# Patient Record
Sex: Male | Born: 1955 | Race: White | Hispanic: No | Smoking: Never smoker
Health system: Southern US, Community
[De-identification: ages and names within clinical notes are randomized; demographics above are authoritative.]

## PROBLEM LIST (undated history)

## (undated) DIAGNOSIS — K219 Gastro-esophageal reflux disease without esophagitis: Secondary | ICD-10-CM

## (undated) DIAGNOSIS — M1990 Unspecified osteoarthritis, unspecified site: Secondary | ICD-10-CM

## (undated) DIAGNOSIS — R269 Unspecified abnormalities of gait and mobility: Secondary | ICD-10-CM

## (undated) DIAGNOSIS — F329 Major depressive disorder, single episode, unspecified: Secondary | ICD-10-CM

## (undated) DIAGNOSIS — M1711 Unilateral primary osteoarthritis, right knee: Secondary | ICD-10-CM

## (undated) DIAGNOSIS — F319 Bipolar disorder, unspecified: Secondary | ICD-10-CM

## (undated) DIAGNOSIS — F419 Anxiety disorder, unspecified: Secondary | ICD-10-CM

## (undated) DIAGNOSIS — F4310 Post-traumatic stress disorder, unspecified: Secondary | ICD-10-CM

## (undated) DIAGNOSIS — F32A Depression, unspecified: Secondary | ICD-10-CM

## (undated) HISTORY — DX: Unspecified abnormalities of gait and mobility: R26.9

## (undated) HISTORY — PX: KNEE SURGERY: SHX244

## (undated) HISTORY — PX: ORIF FINGER / THUMB FRACTURE: SUR932

## (undated) HISTORY — DX: Post-traumatic stress disorder, unspecified: F43.10

## (undated) HISTORY — DX: Depression, unspecified: F32.A

## (undated) HISTORY — DX: Anxiety disorder, unspecified: F41.9

## (undated) HISTORY — DX: Major depressive disorder, single episode, unspecified: F32.9

---
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
## 1989-04-27 HISTORY — PX: HERNIA REPAIR: SHX51

## 1989-04-27 HISTORY — PX: WRIST SURGERY: SHX841

## 2004-06-28 ENCOUNTER — Ambulatory Visit (HOSPITAL_COMMUNITY): Admission: RE | Admit: 2004-06-28 | Discharge: 2004-06-28 | Payer: Self-pay | Admitting: Orthopedic Surgery

## 2004-07-22 ENCOUNTER — Ambulatory Visit: Payer: Self-pay | Admitting: Pulmonary Disease

## 2004-07-22 ENCOUNTER — Ambulatory Visit: Admission: RE | Admit: 2004-07-22 | Discharge: 2004-07-22 | Payer: Self-pay | Admitting: Otolaryngology

## 2007-04-15 ENCOUNTER — Ambulatory Visit (HOSPITAL_COMMUNITY): Admission: RE | Admit: 2007-04-15 | Discharge: 2007-04-15 | Payer: Self-pay | Admitting: Family Medicine

## 2007-06-07 ENCOUNTER — Emergency Department (HOSPITAL_COMMUNITY): Admission: EM | Admit: 2007-06-07 | Discharge: 2007-06-08 | Payer: Self-pay | Admitting: Emergency Medicine

## 2011-01-12 NOTE — Procedures (Signed)
NAMERYDER, MAN                 ACCOUNT NO.:  [id number]   MEDICAL RECORD NO.:  [id number]          PATIENT TYPE:  OUT   LOCATION:  SLEEP LAB                     FACILITY:  [facility]   PHYSICIAN:  Marcelyn Bruins, M.D. [facility] DATE OF BIRTH:  1956-07-22   DATE OF STUDY:  07/22/2004                              NOCTURNAL POLYSOMNOGRAM   REFERRING PHYSICIAN:  Dillard Cannon, MD   INDICATIONS FOR STUDY:  Hypersomnia with sleep apnea. Epworth sleepiness  score is 11.   SLEEP ARCHITECTURE:  The patient had a total sleep time of 352 minutes with  sleep efficiency of 89%. There was decreased slow wave sleep in REM. Sleep  onset latency was slightly prolonged as was REM latency.   IMPRESSION/RECOMMENDATIONS:  1.  Small numbers of obstructive events which do not meet the RDI's criteria      of the obstructive sleep apnea syndrome. The patient, however, did have      very loud snoring and large numbers of nonspecific arousals that are      somewhat concerning for the upper airway resistant syndrome. It should      also be noted that the patient had no sleep in the supine position which      may underestimate his degree of sleep apnea.  2.  No clinically significant cardiac arrhythmias.  3.  Large numbers of leg jerks with significant sleep disruption. Clinical      correlation is suggested.      KC/MEDQ  D:  08/08/2004 15:46:28  T:  08/08/2004 21:54:54  Job:  [id number]

## 2011-06-07 LAB — URINALYSIS, ROUTINE W REFLEX MICROSCOPIC
Bilirubin Urine: NEGATIVE
Glucose, UA: NEGATIVE
Hgb urine dipstick: NEGATIVE
Ketones, ur: NEGATIVE
Nitrite: NEGATIVE
Protein, ur: NEGATIVE
Specific Gravity, Urine: 1.015
Urobilinogen, UA: 0.2
pH: 6.5

## 2012-08-27 HISTORY — PX: SHOULDER SURGERY: SHX246

## 2012-12-08 ENCOUNTER — Ambulatory Visit: Payer: Self-pay | Admitting: Neurology

## 2013-12-17 ENCOUNTER — Encounter: Payer: Self-pay | Admitting: Neurology

## 2013-12-17 ENCOUNTER — Ambulatory Visit (INDEPENDENT_AMBULATORY_CARE_PROVIDER_SITE_OTHER): Payer: Medicare Other | Admitting: Neurology

## 2013-12-17 ENCOUNTER — Encounter (INDEPENDENT_AMBULATORY_CARE_PROVIDER_SITE_OTHER): Payer: Self-pay

## 2013-12-17 VITALS — BP 131/92 | HR 75 | Ht 69.0 in | Wt 234.0 lb

## 2013-12-17 DIAGNOSIS — G219 Secondary parkinsonism, unspecified: Secondary | ICD-10-CM

## 2013-12-17 DIAGNOSIS — R0989 Other specified symptoms and signs involving the circulatory and respiratory systems: Secondary | ICD-10-CM

## 2013-12-17 DIAGNOSIS — E669 Obesity, unspecified: Secondary | ICD-10-CM

## 2013-12-17 DIAGNOSIS — R0683 Snoring: Secondary | ICD-10-CM

## 2013-12-17 DIAGNOSIS — R0609 Other forms of dyspnea: Secondary | ICD-10-CM

## 2013-12-17 DIAGNOSIS — G2119 Other drug induced secondary parkinsonism: Secondary | ICD-10-CM

## 2013-12-17 DIAGNOSIS — R269 Unspecified abnormalities of gait and mobility: Secondary | ICD-10-CM

## 2013-12-17 DIAGNOSIS — W19XXXA Unspecified fall, initial encounter: Secondary | ICD-10-CM

## 2013-12-17 HISTORY — DX: Unspecified abnormalities of gait and mobility: R26.9

## 2013-12-17 NOTE — Patient Instructions (Addendum)
1. Please use a cane for safety, until advised otherwise by Physical therapy.  2. Talk to Dr. Merleen MillinerWinfield about getting a referral to Physical therapy locally.  3. I think your gait and balance disorder is likely from medication effect, due to being on multiple neuro/psychotropic medications.  4. Please discuss with Dr. 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 5. I think I can see you back as needed.  6. I think your brain MRI findings are not concerning.  7. Talk to Dr. Merleen MillinerWinfield about getting a sleep study done.

## 2013-12-17 NOTE — Progress Notes (Signed)
Subjective:    Patient ID: Mark HancockDanny R Hull is a 58 y.o. male.  HPI    Mark FoleySaima Malee Grays, MD, PhD [facility] [address], [address] [address] [place], [place] [address]   Dear Dr. Merleen Hull,  I saw your patient, Mark SouthwardDanny Lolli, upon your kind request in my neurologic clinic today for initial consultation of his gait disorder and recurrent falls. The patient is unaccompanied today. As you know, Mark Hull is a 58 year old right-handed gentleman with an underlying medical history of arthritis, s/p knee surgeries x 3 on the R, s/p L shoulder surgery earlier this year, BPAD, PTSD (followed by Dr. Tomasa Hull), and obesity, status post knee surgery on 09/18/2012, who reports problems with his balance for the past 6-9 months, slowly progressive. He denies vertigo, no lightheadedness, no syncopal spell or pre-syncope, no SOB and overall he feels it was gradual in onset. He [facility] on 11/27/13: minimal non-specific WM changes. He is, of note on multiple psychotropic medications, namely Prozac 80 mg q AM, Lamictal 300 mg daily, gabapentin 800 mg 3 times a day, Percocet twice a day as needed, Ambien, Seroquel 300 mg daily and clonazepam 1 mg twice daily. On a separate note, he endorses snoring and his wife has wondered whether he has pauses in his breathing while asleep. He has never had a sleep study.  I saw him one time before on 10/08/2012 for a different problem, at which time he presented with abnormal sensations and dysesthesias in his lower extremities. At that time, his exam is nonfocal and I felt he may have a form of restless leg syndrome. Those symptoms resolved on their own.  He had been seen in our office several years ago for numbness in his thighs and paresthesias. 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   He is retired/disabled and lives at home with his wife.  He has an [employer] degree and had 1 child that is diceased.  The pt denies the use of tobacco, quit drinking alcohol 30 years ago, quit using recreational drugs 40 years ago and drinks large amounts of caffeinated tea. There is Fhx of cancer, heart disease and DM.   His Past Medical History Is Significant For: Past Medical History  Diagnosis Date  . Depression   . Anxiety   . PTSD (post-traumatic stress disorder)     His Past Surgical History Is Significant For: Past Surgical History  Procedure Laterality Date  . Tonsillectomy  1970's  . Knee surgery Right 1970's, 90's, 2011  . Orif finger / thumb fracture    . Wrist surgery  1990's  . Hernia repair  1990's  . Shoulder surgery  2014    His Family History Is Significant For: Family History  Problem Relation Age of Onset  . Cancer Sister   . Heart failure Father   . Cancer Father   . COPD Mother     His Social History Is Significant For: History   Social History  . Marital Status: Married    Spouse Name: N/A    Number of Children: N/A  . Years of Education: N/A   Social History Main Topics  . Smoking status: Never Smoker   . Smokeless tobacco: None  . Alcohol Use: No  . Drug Use: No  . Sexual Activity: None   Other Topics Concern  . None   Social History Narrative  . None    His Allergies Are:  Allergies  Allergen Reactions  . Wellbutrin [Bupropion] Photosensitivity and Anxiety  :   His Current Medications Are:  Outpatient Encounter Prescriptions as of 12/17/2013  Medication Sig  . atorvastatin (LIPITOR) 10 MG tablet Take 1 tablet by mouth daily.  . clonazePAM (KLONOPIN) 1 MG tablet Take 2 tablets by mouth daily.  . finasteride (PROSCAR) 5 MG tablet Take 1 tablet by mouth daily.  Marland Kitchen FLUoxetine (PROZAC) 40 MG capsule Take 2 capsules by mouth daily.  Marland Kitchen gabapentin (NEURONTIN) 800 MG tablet Take 3 tablets by mouth daily.  Marland Kitchen lamoTRIgine (LAMICTAL) 200 MG tablet 1 tablet.  Marland Kitchen omeprazole (PRILOSEC) 20 MG capsule Take 1 capsule by mouth daily.  Marland Kitchen oxyCODONE-acetaminophen  (PERCOCET/ROXICET) 5-325 MG per tablet Take 1 tablet by mouth 2 (two) times daily as needed.  . tamsulosin (FLOMAX) 0.4 MG CAPS capsule   . testosterone (ANDROGEL) 50 MG/5GM (1%) GEL   . VIAGRA 100 MG tablet Take 1 tablet by mouth as needed.  . zolpidem (AMBIEN) 10 MG tablet Take 1 tablet by mouth at bedtime.  Marland Kitchen QUEtiapine (SEROQUEL) 300 MG tablet   :   Review of Systems:  Out of a complete 14 point review of systems, all are reviewed and negative with the exception of these symptoms as listed below:   Review of Systems  Constitutional: Positive for activity change (disinterest) and fatigue.  HENT: Positive for trouble swallowing.   Eyes: Negative.   Respiratory: Positive for apnea (snoring).   Cardiovascular: Negative.   Gastrointestinal: Negative.   Endocrine: Negative.   Genitourinary: Positive for difficulty urinating.       Impotence  Musculoskeletal: Positive for arthralgias and joint swelling.  Skin: Negative.   Allergic/Immunologic: Negative.   Neurological: Positive for tremors and headaches.  Hematological: Negative.   Psychiatric/Behavioral: Positive for dysphoric mood, decreased concentration and agitation. The patient is nervous/anxious.        Bi-Polar disorder, PTSD    Objective:  Neurologic Exam  Physical Exam Physical Examination:   Filed Vitals:   12/17/13 1321  BP: 131/92  Pulse: 75         Physical Examination:   Filed Vitals:   12/17/13 1321  BP: 131/92  Pulse: 75   General Examination: The patient is a very pleasant 58 y.o. male in no acute distress.  HEENT: Normocephalic, atraumatic, pupils are equal, round and reactive to light and accommodation. Funduscopic exam is normal with sharp disc margins noted. Extraocular tracking shows mild saccadic breakdown without nystagmus noted. There is no limitation to his gaze. There is mild decrease in eye blink rate. Hearing is intact. Tympanic membranes are clear bilaterally. Face is symmetric with  mild facial masking and normal facial sensation. There is no lip, neck or jaw tremor. Neck is mildly rigid with intact passive ROM. There are no carotid bruits on auscultation. Oropharynx exam reveals mild mouth dryness. Mild airway crowding is noted. Mallampati is class III. Tongue protrudes centrally and palate elevates symmetrically. Neck size is 18.5 inches. There is no drooling.   Chest: is clear to auscultation without wheezing, rhonchi or crackles noted.  Heart: sounds are regular and normal without murmurs, rubs or gallops noted.   Abdomen: is soft, non-tender and non-distended with normal bowel sounds appreciated on auscultation.  Extremities: There is no pitting edema in the distal lower extremities bilaterally. Pedal pulses are intact. There are no varicose veins.  Skin: is warm and dry with no trophic changes noted. Age-related changes are noted on the skin.   Musculoskeletal: exam reveals  no obvious joint deformities, tenderness, joint swelling or erythema.  Neurologically:  Mental status: The patient is awake and alert, paying good  attention. He is able to completely provide the history. He is oriented to: person, place, time/date, situation, day of week, month of year and year. His memory, attention, language and knowledge are intact. There is no aphasia, agnosia, apraxia or anomia. There is a mild degree of bradyphrenia. Speech is mildly hypophonic with no dysarthria noted. Mood is congruent and affect is blunted.  Cranial nerves are as described above under HEENT exam. In addition, shoulder shrug is normal with equal shoulder height noted.  Motor exam: Normal bulk, and strength for age is noted. There are no dyskinesias noted. Tone is not overtly rigid with absence of cogwheeling in the upper extremities. There is overall mild bradykinesia. There is no drift or rebound.  There is no tremor. Romberg is negative.  Reflexes are 1+ in the upper extremities and 2+ in the lower  extremities. Toes are downgoing bilaterally.  Fine motor skills exam: Finger taps are mildly impaired on the right and mildly impaired on the left. Hand movements are mildly impaired on the right and mildly impaired on the left. RAP (rapid alternating patting) is mildly impaired on the right and mildly impaired on the left. Foot taps are mildly impaired on the right and mildly impaired on the left. Foot agility (in the form of heel stomping) is mildly impaired on the right and mildly impaired on the left.    Cerebellar testing shows no dysmetria or intention tremor on finger to nose testing. Heel to shin is unremarkable bilaterally. There is no truncal or gait ataxia.   Sensory exam is intact to light touch, pinprick, vibration, temperature sense and proprioception in the upper and lower extremities.   Gait, station and balance: He stands up from the seated position with mild difficulty and does not need to push up with His hands. He needs no assistance. No veering to one side is noted. He is not noted to lean to the side. Posture is mildly stooped, advanced for age. Stance is narrow-based. He walks with decrease in stride length and pace and decreased arm swing b/l and has a tendency to look down at his feet while walking. He turns in 3 steps. Tandem walk is rather difficult for him and balance is mildly impaired. He has intact toe stands but has difficulty with heel stance.     Assessment and Plan:   In summary, Mark [facility] is a very pleasant 57 y.o.-year old male with an underlying medical history of arthritis, s/p knee surgeries x 3 on the R, s/p L shoulder surgery earlier this year, BPAD, PTSD (followed by Dr. Tomasa Rand), and obesity, status post knee surgery on 09/18/2012, who reports problems with his balance for the past 6-9 months, slowly progressive. His history and physical examination are consistent with a gait disorder and mild parkinsonism. His balance is mildly off. He is advised that he  most likely has problems with his gait and balance due to medication side effects. He is on multiple psychotropic medications and my worry is that he is now experiencing side effects from being on these medicines. 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 He is advised to start using a cane for safety. Furthermore, he is advised to  talk with you about a referral to physical therapy for gait and balance training and he should be able to do this locally. He is also endorsing snoring and some concern for apneas in his sleep. He would benefit from a sleep study which he can pursue locally as well and he is reminded to bring this up with you. If needed we would be happy to do this here but that would be a rather long drive for him. I explained to him that even though he has been on these medications for quite some time, with aging and with time the body can start getting more sensitive to some of the side effects. I have encouraged him to talk to his psychiatrist about potentially lowering some of his medications. He is agreeable to this. From my end of things there is really not a whole lot I would be able to for. His MRI brain does not suggest any sinister disease and shows only mild white matter changes which are nonspecific. I reassured him in that regard. I would be happy to see him back on an as-needed basis. He was in agreement.   Thank you very much for allowing me to participate in the care of this nice patient. If I can be of any further assistance to you please do not hesitate to call me at [phone].  Sincerely,   Mark FoleySaima Aliannah Holstrom, MD, PhD

## 2014-11-10 ENCOUNTER — Other Ambulatory Visit: Payer: Self-pay | Admitting: Physical Medicine and Rehabilitation

## 2014-11-10 DIAGNOSIS — M25511 Pain in right shoulder: Secondary | ICD-10-CM

## 2014-11-22 ENCOUNTER — Ambulatory Visit
Admission: RE | Admit: 2014-11-22 | Discharge: 2014-11-22 | Disposition: A | Discharge status: Home / Self Care | Payer: Medicare Other | Source: Ambulatory Visit | Attending: Physical Medicine and Rehabilitation | Admitting: Physical Medicine and Rehabilitation

## 2014-11-22 DIAGNOSIS — M25511 Pain in right shoulder: Secondary | ICD-10-CM

## 2014-11-22 MED ORDER — IOHEXOL 180 MG/ML  SOLN
12.0000 mL | Freq: Once | INTRAMUSCULAR | Status: AC | PRN
  Administered 2014-11-22: 12 mL via INTRA_ARTICULAR

## 2016-10-19 ENCOUNTER — Other Ambulatory Visit: Payer: Self-pay | Admitting: Orthopedic Surgery

## 2016-10-25 ENCOUNTER — Encounter (HOSPITAL_COMMUNITY): Payer: Self-pay

## 2016-10-25 ENCOUNTER — Encounter (HOSPITAL_COMMUNITY)
Admission: RE | Admit: 2016-10-25 | Discharge: 2016-10-25 | Disposition: A | Discharge status: Home / Self Care | Payer: Medicare Other | Source: Ambulatory Visit | Attending: Orthopedic Surgery | Admitting: Orthopedic Surgery

## 2016-10-25 DIAGNOSIS — Z01812 Encounter for preprocedural laboratory examination: Secondary | ICD-10-CM | POA: Diagnosis not present

## 2016-10-25 DIAGNOSIS — M1711 Unilateral primary osteoarthritis, right knee: Secondary | ICD-10-CM | POA: Insufficient documentation

## 2016-10-25 HISTORY — DX: Gastro-esophageal reflux disease without esophagitis: K21.9

## 2016-10-25 HISTORY — DX: Unspecified osteoarthritis, unspecified site: M19.90

## 2016-10-25 HISTORY — DX: Bipolar disorder, unspecified: F31.9

## 2016-10-25 LAB — CBC
HEMATOCRIT: 42.8 % (ref 39.0–52.0)
HEMOGLOBIN: 14.8 g/dL (ref 13.0–17.0)
MCH: 31.3 pg (ref 26.0–34.0)
MCHC: 34.6 g/dL (ref 30.0–36.0)
MCV: 90.5 fL (ref 78.0–100.0)
Platelets: 248 10*3/uL (ref 150–400)
RBC: 4.73 MIL/uL (ref 4.22–5.81)
RDW: 12.9 % (ref 11.5–15.5)
WBC: 6.4 10*3/uL (ref 4.0–10.5)

## 2016-10-25 LAB — BASIC METABOLIC PANEL
Anion gap: 8 (ref 5–15)
BUN: 9 mg/dL (ref 6–20)
CHLORIDE: 104 mmol/L (ref 101–111)
CO2: 27 mmol/L (ref 22–32)
Calcium: 9.6 mg/dL (ref 8.9–10.3)
Creatinine, Ser: 0.96 mg/dL (ref 0.61–1.24)
GFR calc Af Amer: >60 mL/min (ref >60)
GFR calc non Af Amer: >60 mL/min (ref >60)
Glucose, Bld: 81 mg/dL (ref 65–99)
POTASSIUM: 4.3 mmol/L (ref 3.5–5.1)
Sodium: 139 mmol/L (ref 135–145)

## 2016-10-25 LAB — SURGICAL PCR SCREEN
MRSA, PCR: NEGATIVE
Staphylococcus aureus: POSITIVE — AB

## 2016-10-25 NOTE — Pre-Procedure Instructions (Signed)
[facility]  10/25/2016      [id number] [place] [place] - [address] [facility] [address] [place] [id number] Phone: [phone] Fax: [phone]  [phone] [place] - [address] [place] [id number] Phone: [phone] Fax: [phone]  [id number] [place] [place] - [address] [place] [id number] Phone: [phone] Fax: [phone]    Your procedure is scheduled on Tuesday March 13.  [facility] at [facility]:00 A.M.  Call this number if you have problems the morning of surgery:  [phone]   Remember:  Do not eat food or drink liquids after midnight.  Take these medicines the morning of surgery with A SIP OF WATER: clonazepam (klonopni), fluoxetine (prozac), gabapentin (neurontin), omeprazole (prilosec), oxycodone (percocet) if needed  7 days prior to surgery STOP taking any Aspirin, Aleve, Naproxen, Ibuprofen, Motrin, Advil, Goody's, BC's, all herbal medications, fish oil, and all vitamins    Do not wear jewelry, make-up or nail polish.  Do not wear lotions, powders, or perfumes, or deoderant.  Do not shave 48 hours prior to surgery.  Men may shave face and neck.  Do not bring valuables to the hospital.  [facility] is not responsible for any belongings or valuables.  Contacts, dentures or bridgework may not be worn into surgery.  Leave your suitcase in the car.  After surgery it may be brought to your room.  For patients admitted to the hospital, discharge time will be determined by your treatment team.  Patients discharged the day of surgery will not be allowed to drive home.   Special instructions:    [facility]- Preparing For Surgery  Before surgery, you can play an important role. Because skin is not sterile, your skin needs to be as free of germs as possible. You can reduce the number of germs on your skin by  washing with CHG (chlorahexidine gluconate) Soap before surgery.  CHG is an antiseptic cleaner which kills germs and bonds with the skin to continue killing germs even after washing.  Please do not use if you have an allergy to CHG or antibacterial soaps. If your skin becomes reddened/irritated stop using the CHG.  Do not shave (including legs and underarms) for at least 48 hours prior to first CHG shower. It is OK to shave your face.  Please follow these instructions carefully.   1. Shower the NIGHT BEFORE SURGERY and the MORNING OF SURGERY with CHG.   2. If you chose to wash your hair, wash your hair first as usual with your normal shampoo.  3. After you shampoo, rinse your hair and body thoroughly to remove the shampoo.  4. Use CHG as you would any other liquid soap. You can apply CHG directly to the skin and wash gently with a scrungie or a clean washcloth.   5. Apply the CHG Soap to your body ONLY FROM THE NECK DOWN.  Do not use on open wounds or open sores. Avoid contact with your eyes, ears, mouth and genitals (private parts). Wash genitals (private parts) with your normal soap.  6. Wash thoroughly, paying special attention to the area where your surgery will be performed.  7. Thoroughly rinse your body with warm water from the neck down.  8. DO NOT shower/wash with your normal soap after using and rinsing off the CHG Soap.  9. Pat yourself  dry with a CLEAN TOWEL.   10. Wear CLEAN PAJAMAS   11. Place CLEAN SHEETS on your bed the night of your first shower and DO NOT SLEEP WITH PETS.    Day of Surgery: Do not apply any deodorants/lotions. Please wear clean clothes to the hospital/surgery center.      Please read over the following fact sheets that you were given. Total Joint Packet and MRSA Information

## 2016-10-25 NOTE — Progress Notes (Signed)
PCP: Merleen MillinerWinfield Pt denies cardiologist but states he did have an EKG done by a heart doctor in [place] d/t numbness in his leg. Pt to find doctor's name when gets home and call with name. Will request EKG from doctor and if not received do EKG DOS.   Pt reports having heart cath and stress test >15 years ago that were normal d/t issues with chest pain at the time. Pt denies any symptoms since then.   No chest pain, SOB or signs of infection at PAT appointment.

## 2016-10-25 NOTE — Progress Notes (Signed)
PCR negative MRSA, positive MSSA. Message left for patient on voicemail with call back number if any questions. Prescription called to pharmacy.

## 2016-10-29 NOTE — Progress Notes (Signed)
Spoke with Melodie at Dr. Oneta RackKotlaba's office & requested EKG & last office visit note  [facility].  She reports the EKG is dated from Jan. 2018.

## 2016-11-05 MED ORDER — CEFAZOLIN SODIUM-DEXTROSE 2-4 GM/100ML-% IV SOLN
2.0000 g | INTRAVENOUS | Status: AC
  Administered 2016-11-06: 2 g via INTRAVENOUS
  Filled 2016-11-05: qty 100

## 2016-11-06 ENCOUNTER — Encounter (HOSPITAL_COMMUNITY): Payer: Self-pay | Admitting: *Deleted

## 2016-11-06 ENCOUNTER — Inpatient Hospital Stay (HOSPITAL_COMMUNITY): Payer: Medicare Other

## 2016-11-06 ENCOUNTER — Inpatient Hospital Stay (HOSPITAL_COMMUNITY)
Admission: RE | Admit: 2016-11-06 | Discharge: 2016-11-07 | DRG: 470 | Disposition: A | Discharge status: Home Health | Payer: Medicare Other | Source: Ambulatory Visit | Attending: Orthopedic Surgery | Admitting: Orthopedic Surgery

## 2016-11-06 ENCOUNTER — Encounter (HOSPITAL_COMMUNITY)
Admission: RE | Disposition: A | Discharge status: Home Health | Payer: Self-pay | Source: Ambulatory Visit | Attending: Orthopedic Surgery

## 2016-11-06 ENCOUNTER — Inpatient Hospital Stay (HOSPITAL_COMMUNITY): Payer: Medicare Other | Admitting: Anesthesiology

## 2016-11-06 DIAGNOSIS — Z79899 Other long term (current) drug therapy: Secondary | ICD-10-CM | POA: Diagnosis not present

## 2016-11-06 DIAGNOSIS — Z7982 Long term (current) use of aspirin: Secondary | ICD-10-CM

## 2016-11-06 DIAGNOSIS — K219 Gastro-esophageal reflux disease without esophagitis: Secondary | ICD-10-CM | POA: Diagnosis present

## 2016-11-06 DIAGNOSIS — Z96651 Presence of right artificial knee joint: Secondary | ICD-10-CM

## 2016-11-06 DIAGNOSIS — Z7901 Long term (current) use of anticoagulants: Secondary | ICD-10-CM

## 2016-11-06 DIAGNOSIS — M2341 Loose body in knee, right knee: Secondary | ICD-10-CM | POA: Diagnosis not present

## 2016-11-06 DIAGNOSIS — Z809 Family history of malignant neoplasm, unspecified: Secondary | ICD-10-CM | POA: Diagnosis not present

## 2016-11-06 DIAGNOSIS — M25561 Pain in right knee: Secondary | ICD-10-CM | POA: Diagnosis present

## 2016-11-06 DIAGNOSIS — M1711 Unilateral primary osteoarthritis, right knee: Secondary | ICD-10-CM

## 2016-11-06 DIAGNOSIS — Z96659 Presence of unspecified artificial knee joint: Secondary | ICD-10-CM

## 2016-11-06 DIAGNOSIS — M1731 Unilateral post-traumatic osteoarthritis, right knee: Secondary | ICD-10-CM | POA: Diagnosis present

## 2016-11-06 DIAGNOSIS — F4310 Post-traumatic stress disorder, unspecified: Secondary | ICD-10-CM | POA: Diagnosis not present

## 2016-11-06 DIAGNOSIS — Z825 Family history of asthma and other chronic lower respiratory diseases: Secondary | ICD-10-CM | POA: Diagnosis not present

## 2016-11-06 DIAGNOSIS — F319 Bipolar disorder, unspecified: Secondary | ICD-10-CM | POA: Diagnosis not present

## 2016-11-06 DIAGNOSIS — Z8249 Family history of ischemic heart disease and other diseases of the circulatory system: Secondary | ICD-10-CM

## 2016-11-06 HISTORY — DX: Unilateral primary osteoarthritis, right knee: M17.11

## 2016-11-06 HISTORY — PX: TOTAL KNEE ARTHROPLASTY: SHX125

## 2016-11-06 SURGERY — ARTHROPLASTY, KNEE, TOTAL
Anesthesia: Spinal | Site: Knee | Laterality: Right

## 2016-11-06 MED ORDER — MIDAZOLAM HCL 2 MG/2ML IJ SOLN
INTRAMUSCULAR | Status: AC
  Filled 2016-11-06: qty 2

## 2016-11-06 MED ORDER — ONDANSETRON HCL 4 MG/2ML IJ SOLN: 4.0000 mg | Freq: Once | INTRAMUSCULAR | Status: DC | PRN

## 2016-11-06 MED ORDER — METOCLOPRAMIDE HCL 5 MG PO TABS: 5.0000 mg | ORAL_TABLET | Freq: Three times a day (TID) | ORAL | Status: DC | PRN

## 2016-11-06 MED ORDER — 0.9 % SODIUM CHLORIDE (POUR BTL) OPTIME
TOPICAL | Status: DC | PRN
  Administered 2016-11-06: 1000 mL

## 2016-11-06 MED ORDER — POTASSIUM CHLORIDE IN NACL 20-0.45 MEQ/L-% IV SOLN
INTRAVENOUS | Status: DC
  Administered 2016-11-06: 23:00:00 via INTRAVENOUS
  Filled 2016-11-06 (×3): qty 1000

## 2016-11-06 MED ORDER — OXYCODONE HCL 5 MG PO TABS
5.0000 mg | ORAL_TABLET | Freq: Once | ORAL | Status: DC | PRN
Start: 2016-11-06 — End: 2016-11-06

## 2016-11-06 MED ORDER — ONDANSETRON HCL 4 MG/2ML IJ SOLN
INTRAMUSCULAR | Status: AC
  Filled 2016-11-06: qty 2

## 2016-11-06 MED ORDER — FENTANYL CITRATE (PF) 100 MCG/2ML IJ SOLN
INTRAMUSCULAR | Status: AC
  Filled 2016-11-06: qty 2

## 2016-11-06 MED ORDER — QUETIAPINE FUMARATE 300 MG PO TABS
300.0000 mg | ORAL_TABLET | Freq: Every day | ORAL | Status: DC
  Administered 2016-11-06: 300 mg via ORAL
  Filled 2016-11-06: qty 1

## 2016-11-06 MED ORDER — HYDROMORPHONE HCL 2 MG/ML IJ SOLN: 0.5000 mg | INTRAMUSCULAR | Status: DC | PRN

## 2016-11-06 MED ORDER — SENNA-DOCUSATE SODIUM 8.6-50 MG PO TABS: 2.0000 | ORAL_TABLET | Freq: Every day | ORAL | 1 refills | Status: DC

## 2016-11-06 MED ORDER — TESTOSTERONE 50 MG/5GM (1%) TD GEL
5.0000 g | Freq: Every day | TRANSDERMAL | Status: DC
Start: 2016-11-06 — End: 2016-11-06

## 2016-11-06 MED ORDER — TIZANIDINE HCL 4 MG PO TABS: 4.0000 mg | ORAL_TABLET | Freq: Four times a day (QID) | ORAL | 0 refills | Status: DC | PRN

## 2016-11-06 MED ORDER — FENTANYL CITRATE (PF) 100 MCG/2ML IJ SOLN
25.0000 ug | INTRAMUSCULAR | Status: DC | PRN
  Administered 2016-11-06 (×2): 50 ug via INTRAVENOUS

## 2016-11-06 MED ORDER — ZOLPIDEM TARTRATE 5 MG PO TABS
5.0000 mg | ORAL_TABLET | Freq: Every day | ORAL | Status: DC
  Administered 2016-11-06: 5 mg via ORAL
  Filled 2016-11-06: qty 1

## 2016-11-06 MED ORDER — CLONAZEPAM 1 MG PO TABS
2.0000 mg | ORAL_TABLET | Freq: Every day | ORAL | Status: DC
  Administered 2016-11-07: 2 mg via ORAL
  Filled 2016-11-06: qty 2

## 2016-11-06 MED ORDER — MAGNESIUM CITRATE PO SOLN: 1.0000 | Freq: Once | ORAL | Status: DC | PRN

## 2016-11-06 MED ORDER — RIVAROXABAN 10 MG PO TABS: 10.0000 mg | ORAL_TABLET | Freq: Every day | ORAL | 0 refills | Status: DC

## 2016-11-06 MED ORDER — LACTATED RINGERS IV SOLN
INTRAVENOUS | Status: DC
  Administered 2016-11-06 (×4): via INTRAVENOUS

## 2016-11-06 MED ORDER — ACETAMINOPHEN 650 MG RE SUPP: 650.0000 mg | Freq: Four times a day (QID) | RECTAL | Status: DC | PRN

## 2016-11-06 MED ORDER — ALUM & MAG HYDROXIDE-SIMETH 200-200-20 MG/5ML PO SUSP: 30.0000 mL | ORAL | Status: DC | PRN

## 2016-11-06 MED ORDER — MIDAZOLAM HCL 5 MG/5ML IJ SOLN
INTRAMUSCULAR | Status: DC | PRN
  Administered 2016-11-06: 2 mg via INTRAVENOUS

## 2016-11-06 MED ORDER — ONDANSETRON HCL 4 MG PO TABS: 4.0000 mg | ORAL_TABLET | Freq: Four times a day (QID) | ORAL | Status: DC | PRN

## 2016-11-06 MED ORDER — FENTANYL CITRATE (PF) 100 MCG/2ML IJ SOLN: 25.0000 ug | INTRAMUSCULAR | Status: DC | PRN

## 2016-11-06 MED ORDER — FENTANYL CITRATE (PF) 100 MCG/2ML IJ SOLN
100.0000 ug | Freq: Once | INTRAMUSCULAR | Status: AC
  Administered 2016-11-06: 100 ug via INTRAVENOUS

## 2016-11-06 MED ORDER — ATORVASTATIN CALCIUM 10 MG PO TABS
10.0000 mg | ORAL_TABLET | Freq: Every day | ORAL | Status: DC
  Administered 2016-11-06: 10 mg via ORAL
  Filled 2016-11-06: qty 1

## 2016-11-06 MED ORDER — PHENOL 1.4 % MT LIQD: 1.0000 | OROMUCOSAL | Status: DC | PRN

## 2016-11-06 MED ORDER — OXYCODONE HCL 5 MG PO TABS
5.0000 mg | ORAL_TABLET | ORAL | Status: DC | PRN
  Administered 2016-11-06 – 2016-11-07 (×7): 10 mg via ORAL
  Filled 2016-11-06 (×7): qty 2

## 2016-11-06 MED ORDER — KETOROLAC TROMETHAMINE 15 MG/ML IJ SOLN
7.5000 mg | Freq: Four times a day (QID) | INTRAMUSCULAR | Status: AC
  Administered 2016-11-06 – 2016-11-07 (×4): 7.5 mg via INTRAVENOUS
  Filled 2016-11-06 (×5): qty 1

## 2016-11-06 MED ORDER — PANTOPRAZOLE SODIUM 40 MG PO TBEC
80.0000 mg | DELAYED_RELEASE_TABLET | Freq: Every day | ORAL | Status: DC
  Administered 2016-11-06 – 2016-11-07 (×2): 80 mg via ORAL
  Filled 2016-11-06 (×2): qty 2

## 2016-11-06 MED ORDER — METHOCARBAMOL 1000 MG/10ML IJ SOLN
500.0000 mg | Freq: Four times a day (QID) | INTRAVENOUS | Status: DC | PRN
  Filled 2016-11-06: qty 5

## 2016-11-06 MED ORDER — PROPOFOL 10 MG/ML IV BOLUS
INTRAVENOUS | Status: AC
  Filled 2016-11-06: qty 20

## 2016-11-06 MED ORDER — FENTANYL CITRATE (PF) 100 MCG/2ML IJ SOLN
INTRAMUSCULAR | Status: DC | PRN
  Administered 2016-11-06 (×2): 50 ug via INTRAVENOUS

## 2016-11-06 MED ORDER — ONDANSETRON HCL 4 MG/2ML IJ SOLN
INTRAMUSCULAR | Status: DC | PRN
  Administered 2016-11-06: 4 mg via INTRAVENOUS

## 2016-11-06 MED ORDER — FLUOXETINE HCL 20 MG PO CAPS
80.0000 mg | ORAL_CAPSULE | Freq: Every day | ORAL | Status: DC
  Administered 2016-11-06: 80 mg via ORAL
  Filled 2016-11-06: qty 4

## 2016-11-06 MED ORDER — DOCUSATE SODIUM 100 MG PO CAPS
100.0000 mg | ORAL_CAPSULE | Freq: Two times a day (BID) | ORAL | Status: DC
  Administered 2016-11-06 – 2016-11-07 (×2): 100 mg via ORAL
  Filled 2016-11-06 (×2): qty 1

## 2016-11-06 MED ORDER — ACETAMINOPHEN 325 MG PO TABS: 650.0000 mg | ORAL_TABLET | Freq: Four times a day (QID) | ORAL | Status: DC | PRN

## 2016-11-06 MED ORDER — GABAPENTIN 400 MG PO CAPS
800.0000 mg | ORAL_CAPSULE | Freq: Three times a day (TID) | ORAL | Status: DC
  Administered 2016-11-06 – 2016-11-07 (×3): 800 mg via ORAL
  Filled 2016-11-06 (×3): qty 2

## 2016-11-06 MED ORDER — CEFAZOLIN SODIUM-DEXTROSE 2-4 GM/100ML-% IV SOLN
2.0000 g | Freq: Four times a day (QID) | INTRAVENOUS | Status: AC
  Administered 2016-11-06 (×2): 2 g via INTRAVENOUS
  Filled 2016-11-06 (×2): qty 100

## 2016-11-06 MED ORDER — RIVAROXABAN 10 MG PO TABS
10.0000 mg | ORAL_TABLET | Freq: Every day | ORAL | Status: DC
  Administered 2016-11-07: 10 mg via ORAL
  Filled 2016-11-06: qty 1

## 2016-11-06 MED ORDER — ARTIFICIAL TEARS OP OINT
TOPICAL_OINTMENT | OPHTHALMIC | Status: AC
  Filled 2016-11-06: qty 3.5

## 2016-11-06 MED ORDER — MIDAZOLAM HCL 2 MG/2ML IJ SOLN
2.0000 mg | Freq: Once | INTRAMUSCULAR | Status: AC
Start: 2016-11-06 — End: 2016-11-06
  Administered 2016-11-06: 2 mg via INTRAVENOUS

## 2016-11-06 MED ORDER — OXYCODONE HCL 5 MG/5ML PO SOLN: 5.0000 mg | Freq: Once | ORAL | Status: DC | PRN

## 2016-11-06 MED ORDER — EPHEDRINE SULFATE 50 MG/ML IJ SOLN
INTRAMUSCULAR | Status: DC | PRN
  Administered 2016-11-06: 5 mg via INTRAVENOUS
  Administered 2016-11-06: 10 mg via INTRAVENOUS
  Administered 2016-11-06: 5 mg via INTRAVENOUS
  Administered 2016-11-06: 10 mg via INTRAVENOUS

## 2016-11-06 MED ORDER — PROPOFOL 500 MG/50ML IV EMUL
INTRAVENOUS | Status: DC | PRN
  Administered 2016-11-06: 100 ug/kg/min via INTRAVENOUS

## 2016-11-06 MED ORDER — POLYETHYLENE GLYCOL 3350 17 G PO PACK: 17.0000 g | PACK | Freq: Every day | ORAL | Status: DC | PRN

## 2016-11-06 MED ORDER — DIPHENHYDRAMINE HCL 12.5 MG/5ML PO ELIX: 12.5000 mg | ORAL_SOLUTION | ORAL | Status: DC | PRN

## 2016-11-06 MED ORDER — BISACODYL 10 MG RE SUPP
10.0000 mg | Freq: Every day | RECTAL | Status: DC | PRN
Start: 2016-11-06 — End: 2016-11-07

## 2016-11-06 MED ORDER — ONDANSETRON HCL 4 MG PO TABS: 4.0000 mg | ORAL_TABLET | Freq: Three times a day (TID) | ORAL | 0 refills | Status: DC | PRN

## 2016-11-06 MED ORDER — PHENYLEPHRINE HCL 10 MG/ML IJ SOLN
INTRAVENOUS | Status: DC | PRN
  Administered 2016-11-06: 40 ug/min via INTRAVENOUS

## 2016-11-06 MED ORDER — ONDANSETRON HCL 4 MG/2ML IJ SOLN: 4.0000 mg | Freq: Four times a day (QID) | INTRAMUSCULAR | Status: DC | PRN

## 2016-11-06 MED ORDER — SENNA 8.6 MG PO TABS
1.0000 | ORAL_TABLET | Freq: Two times a day (BID) | ORAL | Status: DC
  Administered 2016-11-06 – 2016-11-07 (×2): 8.6 mg via ORAL
  Filled 2016-11-06 (×2): qty 1

## 2016-11-06 MED ORDER — ROCURONIUM BROMIDE 50 MG/5ML IV SOSY
PREFILLED_SYRINGE | INTRAVENOUS | Status: AC
  Filled 2016-11-06: qty 5

## 2016-11-06 MED ORDER — LAMOTRIGINE 100 MG PO TABS
200.0000 mg | ORAL_TABLET | Freq: Every day | ORAL | Status: DC
  Administered 2016-11-06: 200 mg via ORAL
  Filled 2016-11-06: qty 2

## 2016-11-06 MED ORDER — MENTHOL 3 MG MT LOZG: 1.0000 | LOZENGE | OROMUCOSAL | Status: DC | PRN

## 2016-11-06 MED ORDER — LIDOCAINE 2% (20 MG/ML) 5 ML SYRINGE
INTRAMUSCULAR | Status: AC
  Filled 2016-11-06: qty 5

## 2016-11-06 MED ORDER — DEXAMETHASONE SODIUM PHOSPHATE 10 MG/ML IJ SOLN
INTRAMUSCULAR | Status: AC
  Filled 2016-11-06: qty 1

## 2016-11-06 MED ORDER — METHOCARBAMOL 500 MG PO TABS
500.0000 mg | ORAL_TABLET | Freq: Four times a day (QID) | ORAL | Status: DC | PRN
  Administered 2016-11-06 – 2016-11-07 (×2): 500 mg via ORAL
  Filled 2016-11-06 (×2): qty 1

## 2016-11-06 MED ORDER — OXYCODONE HCL 5 MG PO TABS: 5.0000 mg | ORAL_TABLET | Freq: Once | ORAL | Status: DC | PRN

## 2016-11-06 MED ORDER — OXYCODONE-ACETAMINOPHEN 10-325 MG PO TABS: 1.0000 | ORAL_TABLET | Freq: Four times a day (QID) | ORAL | 0 refills | Status: DC | PRN

## 2016-11-06 MED ORDER — METOCLOPRAMIDE HCL 5 MG/ML IJ SOLN: 5.0000 mg | Freq: Three times a day (TID) | INTRAMUSCULAR | Status: DC | PRN

## 2016-11-06 SURGICAL SUPPLY — 55 items
BANDAGE ESMARK 6X9 LF (GAUZE/BANDAGES/DRESSINGS) ×1 IMPLANT
BLADE SAG 18X100X1.27 (BLADE) ×3 IMPLANT
BNDG CMPR MED 15X6 ELC VLCR LF (GAUZE/BANDAGES/DRESSINGS) ×1
BNDG ELASTIC 6X15 VLCR STRL LF (GAUZE/BANDAGES/DRESSINGS) ×3 IMPLANT
BNDG ESMARK 6X9 LF (GAUZE/BANDAGES/DRESSINGS) ×3
BOWL SMART MIX CTS (DISPOSABLE) ×3 IMPLANT
CAPT KNEE TOTAL 3 ×3 IMPLANT
CEMENT HV SMART SET (Cement) ×6 IMPLANT
CLOSURE STERI-STRIP 1/2X4 (GAUZE/BANDAGES/DRESSINGS) ×1
CLSR STERI-STRIP ANTIMIC 1/2X4 (GAUZE/BANDAGES/DRESSINGS) ×2 IMPLANT
COVER SURGICAL LIGHT HANDLE (MISCELLANEOUS) ×3 IMPLANT
CUFF TOURNIQUET SINGLE 34IN LL (TOURNIQUET CUFF) ×3 IMPLANT
DRAPE EXTREMITY T 121X128X90 (DRAPE) IMPLANT
DRAPE HALF SHEET 40X57 (DRAPES) IMPLANT
DRAPE PROXIMA HALF (DRAPES) IMPLANT
DRAPE U-SHAPE 47X51 STRL (DRAPES) ×3 IMPLANT
DURAPREP 26ML APPLICATOR (WOUND CARE) ×6 IMPLANT
ELECT CAUTERY BLADE 6.4 (BLADE) ×3 IMPLANT
ELECT REM PT RETURN 9FT ADLT (ELECTROSURGICAL) ×3
ELECTRODE REM PT RTRN 9FT ADLT (ELECTROSURGICAL) ×1 IMPLANT
GAUZE SPONGE 4X4 12PLY STRL (GAUZE/BANDAGES/DRESSINGS) ×3 IMPLANT
GLOVE BIOGEL PI ORTHO PRO SZ8 (GLOVE) ×4
GLOVE ORTHO TXT STRL SZ7.5 (GLOVE) ×3 IMPLANT
GLOVE PI ORTHO PRO STRL SZ8 (GLOVE) ×2 IMPLANT
GLOVE SURG ORTHO 8.0 STRL STRW (GLOVE) ×3 IMPLANT
GOWN STRL REUS W/ TWL XL LVL3 (GOWN DISPOSABLE) ×1 IMPLANT
GOWN STRL REUS W/TWL 2XL LVL3 (GOWN DISPOSABLE) ×3 IMPLANT
GOWN STRL REUS W/TWL XL LVL3 (GOWN DISPOSABLE) ×2
HANDPIECE INTERPULSE COAX TIP (DISPOSABLE) ×3
HOOD PEEL AWAY FACE SHEILD DIS (HOOD) ×6 IMPLANT
IMMOBILIZER KNEE 22 UNIV (SOFTGOODS) ×3 IMPLANT
KIT BASIN OR (CUSTOM PROCEDURE TRAY) ×3 IMPLANT
KIT ROOM TURNOVER OR (KITS) ×3 IMPLANT
MANIFOLD NEPTUNE II (INSTRUMENTS) ×3 IMPLANT
NEEDLE HYPO 21X1.5 SAFETY (NEEDLE) IMPLANT
NS IRRIG 1000ML POUR BTL (IV SOLUTION) ×3 IMPLANT
PACK TOTAL JOINT (CUSTOM PROCEDURE TRAY) ×3 IMPLANT
PAD ABD 8X10 STRL (GAUZE/BANDAGES/DRESSINGS) ×3 IMPLANT
PAD ARMBOARD 7.5X6 YLW CONV (MISCELLANEOUS) ×6 IMPLANT
PAD CAST 4YDX4 CTTN HI CHSV (CAST SUPPLIES) ×1 IMPLANT
PADDING CAST COTTON 4X4 STRL (CAST SUPPLIES) ×2
PADDING CAST COTTON 6X4 STRL (CAST SUPPLIES) ×3 IMPLANT
SET HNDPC FAN SPRY TIP SCT (DISPOSABLE) ×1 IMPLANT
SUCTION FRAZIER HANDLE 10FR (MISCELLANEOUS) ×2
SUCTION TUBE FRAZIER 10FR DISP (MISCELLANEOUS) ×1 IMPLANT
SUT MNCRL AB 4-0 PS2 18 (SUTURE) IMPLANT
SUT VIC AB 0 CT1 27 (SUTURE) ×3
SUT VIC AB 0 CT1 27XBRD ANBCTR (SUTURE) ×1 IMPLANT
SUT VIC AB 1 CT1 27 (SUTURE) ×2
SUT VIC AB 1 CT1 27XBRD ANBCTR (SUTURE) ×1 IMPLANT
SUT VIC AB 3-0 SH 8-18 (SUTURE) ×3 IMPLANT
SYR CONTROL 10ML LL (SYRINGE) IMPLANT
TOWEL OR 17X24 6PK STRL BLUE (TOWEL DISPOSABLE) IMPLANT
TOWEL OR 17X26 10 PK STRL BLUE (TOWEL DISPOSABLE) IMPLANT
TRAY FOLEY BAG SILVER LF 14FR (SET/KITS/TRAYS/PACK) ×3 IMPLANT

## 2016-11-06 NOTE — Anesthesia Procedure Notes (Signed)
Procedure Name: MAC Date/Time: 11/06/2016 11:50 AM Performed by: Izora Gala Pre-anesthesia Checklist: Patient identified, Emergency Drugs available, Suction available and Patient being monitored Patient Re-evaluated:Patient Re-evaluated prior to inductionOxygen Delivery Method: Simple face mask Intubation Type: IV induction Placement Confirmation: positive ETCO2

## 2016-11-06 NOTE — Evaluation (Signed)
Physical Therapy Evaluation Patient Details Name: [facility] MRN: [id number] DOB: 12/16/1955 Today's Date: 11/06/2016   History of Present Illness  pt is a 61 y/o male with pmh of bipolar d/o, PTSD, admitted with R knee dysfunction s/p R TKA.  Clinical Impression  Pt admitted with/for elective R TKA.  Pt currently limited functionally due to the problems listed below.  (see problems list.)  Pt will benefit from PT to maximize function and safety to be able to get home safely with available assist of family.     Follow Up Recommendations Home health PT    Equipment Recommendations  Rolling walker with 5" wheels (or crutches)    Recommendations for Other Services       Precautions / Restrictions Precautions Precautions: Knee Precaution Booklet Issued: Yes (comment) Required Braces or Orthoses: Knee Immobilizer - Left Restrictions Weight Bearing Restrictions: Yes RLE Weight Bearing: Weight bearing as tolerated      Mobility  Bed Mobility Overal bed mobility: Modified Independent                Transfers Overall transfer level: Needs assistance   Transfers: Sit to/from Stand Sit to Stand: Supervision            Ambulation/Gait Ambulation/Gait assistance: Supervision Ambulation Distance (Feet): 280 Feet Assistive device: Rolling walker (2 wheeled) Gait Pattern/deviations: Step-to pattern;Step-through pattern   Gait velocity interpretation: at or above normal speed for age/gender General Gait Details: steady, sequenced well and transitioned to normalized pattern  Stairs            Wheelchair Mobility    Modified Rankin (Stroke Patients Only)       Balance Overall balance assessment: Needs assistance   Sitting balance-Leahy Scale: Normal       Standing balance-Leahy Scale: Good                               Pertinent Vitals/Pain Pain Assessment: Faces Faces Pain Scale: Hurts little more Pain Descriptors / Indicators:  Grimacing;Guarding Pain Intervention(s): Monitored during session;Repositioned    Home Living Family/patient expects to be discharged to:: Private residence Living Arrangements: Spouse/significant other Available Help at Discharge: Family;Available PRN/intermittently Type of Home: House Home Access: Level entry     Home Layout: Two level;Bed/bath upstairs Home Equipment: Crutches      Prior Function Level of Independence: Independent               Hand Dominance        Extremity/Trunk Assessment   Upper Extremity Assessment Upper Extremity Assessment: Overall WFL for tasks assessed    Lower Extremity Assessment Lower Extremity Assessment: Overall WFL for tasks assessed;RLE deficits/detail RLE Deficits / Details: 85* flexion R knee,  RLE Coordination: decreased fine motor       Communication   Communication: No difficulties  Cognition Arousal/Alertness: Awake/alert Behavior During Therapy: WFL for tasks assessed/performed Overall Cognitive Status: Within Functional Limits for tasks assessed                      General Comments      Exercises Total Joint Exercises Ankle Circles/Pumps: AROM;20 reps Quad Sets: AROM;Both;10 reps;Supine Gluteal Sets: AROM;Both;10 reps;Supine Heel Slides: AROM;10 reps;Right;Supine Long Arc Quad: AROM;Right;10 reps;Seated Knee Flexion: AROM;Right;10 reps;Seated   Assessment/Plan    PT Assessment Patient needs continued PT services  PT Problem List Decreased strength;Decreased activity tolerance;Decreased mobility;Decreased knowledge of use of DME;Decreased knowledge of precautions;Pain  PT Treatment Interventions DME instruction;Gait training;Stair training;Functional mobility training;Therapeutic activities;Patient/family education;Therapeutic exercise    PT Goals (Current goals can be found in the Care Plan section)  Acute Rehab PT Goals Patient Stated Goal: independent PT Goal Formulation: With  patient Time For Goal Achievement: 11/13/16 Potential to Achieve Goals: Good    Frequency 7X/week   Barriers to discharge Decreased caregiver support wife goes back to work immediately    Co-evaluation               End of Session   Activity Tolerance: Patient tolerated treatment well Patient left: in chair;with call bell/phone within reach;with family/visitor present Nurse Communication: Mobility status PT Visit Diagnosis: Other abnormalities of gait and mobility (R26.89);Pain Pain - Right/Left: Right Pain - part of body: Knee         Time: [id number] PT Time Calculation (min) (ACUTE ONLY): 35 min   Charges:   PT Evaluation $PT Eval Moderate Complexity: 1 Procedure PT Treatments $Gait Training: 8-22 mins   PT G CodesEliseo Gum:         Zayquan Bogard V Azell Bill 11/06/2016, 7:24 PM 11/06/2016  [place] BingKen Leif Loflin, PT [phone]  (pager)

## 2016-11-06 NOTE — Anesthesia Preprocedure Evaluation (Addendum)
Anesthesia Evaluation  Patient identified by MRN, date of birth, ID band Patient awake    Reviewed: Allergy & Precautions, NPO status , Patient's Chart, lab work & pertinent test results  Airway Mallampati: II  TM Distance: >3 FB Neck ROM: Full    Dental  (+) Teeth Intact, Dental Advisory Given   Pulmonary    breath sounds clear to auscultation       Cardiovascular  Rhythm:Regular Rate:Normal     Neuro/Psych    GI/Hepatic   Endo/Other    Renal/GU      Musculoskeletal   Abdominal   Peds  Hematology   Anesthesia Other Findings   Reproductive/Obstetrics                             Anesthesia Physical Anesthesia Plan  ASA: III  Anesthesia Plan: Spinal   Post-op Pain Management:  Regional for Post-op pain   Induction: Intravenous  Airway Management Planned: Simple Face Mask and Natural Airway  Additional Equipment:   Intra-op Plan:   Post-operative Plan:   Informed Consent: I have reviewed the patients History and Physical, chart, labs and discussed the procedure including the risks, benefits and alternatives for the proposed anesthesia with the patient or authorized representative who has indicated his/her understanding and acceptance.   Dental advisory given  Plan Discussed with: CRNA and Anesthesiologist  Anesthesia Plan Comments:        Anesthesia Quick Evaluation

## 2016-11-06 NOTE — Transfer of Care (Signed)
Immediate Anesthesia Transfer of Care Note  Patient: Mark HancockDanny R Schenk  Procedure(s) Performed: Procedure(s): TOTAL KNEE ARTHROPLASTY, RIGHT (Right)  Patient Location: PACU  Anesthesia Type:Spinal  Level of Consciousness: awake  Airway & Oxygen Therapy: Patient Spontanous Breathing  Post-op Assessment: Report given to RN and Post -op Vital signs reviewed and stable  Post vital signs: Reviewed and stable  Last Vitals:  Vitals:   11/06/16 1012 11/06/16 1505  BP: (!) 138/31 111/75  Pulse: 66 72  Resp: 20 16  Temp: 36.5 C     Last Pain:  Vitals:   11/06/16 1012  TempSrc: Oral      Patients Stated Pain Goal: 6 (11/06/16 1014)  Complications: No apparent anesthesia complications

## 2016-11-06 NOTE — Progress Notes (Signed)
Patient in room awake and alert upon entering room at 2005. Patient oriented x 3. Wife at bedside. Patient with no c/o sob. C/o right knee pain s/p surgery; administered prescribed prn pain med. Right knee site wnl. Call bell within reach, belongings at bedside. Will continue to monitor patients pain and status.

## 2016-11-06 NOTE — Anesthesia Procedure Notes (Signed)
Spinal  Start time: 11/06/2016 11:35 AM End time: 11/06/2016 11:40 AM Staffing Anesthesiologist: Noreene LarssonJOSLIN, Lianni Kanaan Performed: anesthesiologist  Preanesthetic Checklist Completed: patient identified, site marked, surgical consent, pre-op evaluation, timeout performed, IV checked, risks and benefits discussed and monitors and equipment checked Spinal Block Patient position: sitting Prep: ChloraPrep Patient monitoring: heart rate, cardiac monitor, continuous pulse ox and blood pressure Approach: midline Location: L3-4 Injection technique: single-shot Needle Needle type: Tuohy  Needle gauge: 22 G Needle insertion depth: 5 cm Assessment Sensory level: T6 Additional Notes 14 mg 0.75% Bupivacaine injected easily

## 2016-11-06 NOTE — Anesthesia Procedure Notes (Addendum)
Anesthesia Regional Block: Adductor canal block   Pre-Anesthetic Checklist: ,, timeout performed, Correct Patient, Correct Site, Correct Laterality, Correct Procedure, Correct Position, site marked, Risks and benefits discussed,  Surgical consent,  Pre-op evaluation,  At surgeon's request and post-op pain management  Laterality: Right  Prep: chloraprep       Needles:  Injection technique: Single-shot  Needle Type: Stimulator Needle - 80          Additional Needles:   Procedures: ultrasound guided,,,,,,,,  Narrative:  Start time: 11/06/2016 10:30 AM End time: 11/06/2016 10:35 AM Injection made incrementally with aspirations every 5 mL.  Performed by: Personally   Additional Notes: 20 cc 0.75% Ropivacaine injected easily

## 2016-11-06 NOTE — Op Note (Signed)
DATE OF SURGERY:  11/06/2016 TIME: 2:26 PM  PATIENT NAME:  Maryruth Hancockanny R Montalto   AGE: 61 y.o.    PRE-OPERATIVE DIAGNOSIS:  Right knee posttraumatic osteoarthritis  POST-OPERATIVE DIAGNOSIS:  Right knee post traumatic osteoarthritis with involvement of the medial compartment, grade 4 changes in the femoral trochlea laterally, as well as grade 3 changes on the lateral femur.  PROCEDURE:  Procedure(s): TOTAL KNEE ARTHROPLASTY, RIGHT   SURGEON:  Eulas PostLANDAU,Timtohy Broski P, MD   ASSISTANT:  Janace LittenBrandon Parry, OPA-C, present and scrubbed throughout the case, critical for assistance with exposure, retraction, instrumentation, and closure.   OPERATIVE IMPLANTS: Biomet Vanguard Fixed Bearing Posterior Stabilized Femur size 72.5, Tibia size 75, Patella size 34 3-peg oval button, with a 10 mm polyethylene insert.   PREOPERATIVE INDICATIONS:  Maryruth HancockDanny R Portner is a 61 y.o. year old male with end stage bone on bone degenerative arthritis of the knee who failed conservative treatment, including injections, antiinflammatories, activity modification, and assistive devices, and had significant impairment of their activities of daily living, and elected for Total Knee Arthroplasty.  Preoperatively he had significant varus, with complete bone-on-bone changes. I was not sure if he was in fact a partial knee candidate, and her counseled him I would have both implants available, and make that decision depending on intraoperative findings.  The risks, benefits, and alternatives were discussed at length including but not limited to the risks of infection, bleeding, nerve injury, stiffness, blood clots, the need for revision surgery, cardiopulmonary complications, among others, and they were willing to proceed.  OPERATIVE FINDINGS AND UNIQUE ASPECTS OF THE CASE:  The knee had significant pseudo-laxity, and had previous well-healed surgical wounds, one was both in the midline and one was a curvilinear medial incision through the skin. I  decided to use the midline approach, as this is my standard approach, and minimizes flap elevation overlying the patella.  He had 2 large loose bodies that were adherent to the undersurface of the quadriceps tendon which I removed during the approach.  It also appeared that the medial collateral ligament was significantly thickened, and had evidence for previous repair. There was also a stitch in the superior aspect of the quadriceps, as well as a stitch in the PCL, and there may have also been one in the anterior cruciate ligament. A stitch in the PCL definitely went through bone tunnels.  Access to the knee was somewhat challenging, I did have to cut the tibia twice, and in fact I had measured my gaps and thought I was balanced, but the trial implants were too tight both in extension and flexion, and so I went back and recut the tibia and then had to re-prepare the tibia with the punch.  The patella had a significant amount of bone contained within the patellar tendon, such that I had a bony palpable shelf distally below the cut. The patella measured 20 mm before the cut, and 14 mm after the cut.  ESTIMATED BLOOD LOSS: 200 mL  OPERATIVE DESCRIPTION:  The patient was brought to the operative room and placed in a supine position.  Spinal anesthesia was administered.  IV antibiotics were given.  The lower extremity was prepped and draped in the usual sterile fashion.  Time out was performed.  The leg was elevated and exsanguinated and the tourniquet was inflated. I started with the leg in the legholder, but I converted to the standard total knee positioners after performing an arthrotomy and finding that he had advanced arthritis underneath the femoral trochlea  both medially and laterally as well as progressing arthritis on the lateral compartment.  Anterior quadriceps tendon splitting approach was performed.  As indicated above, I used a central incision, the upper half of his standard anterior  incision has RAD had a scar, and I did not want to elevate the flap from the medial aspect of the patella. I really only extended the anterior incision that was already present. I identified the loose body underneath the quadriceps and this was removed, there were 2 major pieces to this.  The patella was everted and osteophytes were removed.  The anterior horn of the medial and lateral meniscus was removed.   The distal femur was opened with the drill and the intramedullary distal femoral cutting jig was utilized, set at 5 degrees resecting 9 mm off the distal femur.  Care was taken to protect the collateral ligaments.  Then the extramedullary tibial cutting jig was utilized making the appropriate cut using the anterior tibial crest as a reference building in appropriate posterior slope.  Care was taken during the cut to protect the medial and collateral ligaments.  The proximal tibia was removed along with the posterior horns of the menisci.  The PCL was sacrificed.    The extensor gap was measured and was approximately 10mm, although it was a little bit tight .    The distal femoral sizing jig was applied, taking care to avoid notching.  Then the 4-in-1 cutting jig was applied and the anterior and posterior femur was cut, along with the chamfer cuts.  All posterior osteophytes were removed.  The flexion gap was then measured and was symmetric with the extension gap.  I completed the distal femoral preparation using the appropriate jig to prepare the box.  I placed the trial implants, but despite the gap measurements, I felt it was still too tight, so I went back and reassembled the tibial jig and removed an additional 2 mm. I then had good motion with the trial implants.   The patella was then measured, and cut with the saw.  The thickness before the cut was 20 and after the cut was 14.  The proximal tibia sized and prepared accordingly with the reamer and the punch, and then all components were  trialed with the 10mm poly insert.  The knee was found to have excellent balance and full motion.    The above named components were then cemented into place and all excess cement was removed.  The real polyethylene implant was placed.  After the cement had cured I released the tourniquet and confirmed excellent hemostasis with no major posterior vessel injury.    The knee was easily taken through a range of motion and the patella tracked well and the knee irrigated copiously and the parapatellar and subcutaneous tissue closed with vicryl, and monocryl with steri strips for the skin.  The wounds were injected with marcaine, and dressed with sterile gauze and the patient was awakened and returned to the PACU in stable and satisfactory condition.  There were no complications.  Total tourniquet time was 120 minutes.

## 2016-11-06 NOTE — H&P (Signed)
PREOPERATIVE H&P  Chief Complaint: djd right knee  HPI: Mark Hull is a 61 y.o. male who presents for preoperative history and physical with a diagnosis of djd right knee. Symptoms are rated as moderate to severe, and have been worsening.  This is significantly impairing activities of daily living.  He has elected for surgical management. He has had multiple previous knee operations and failed conservative management.   He has failed injections, activity modification, anti-inflammatories, and assistive devices.  Preoperative X-rays demonstrate end stage degenerative changes with osteophyte formation, loss of joint space, subchondral sclerosis.   Past Medical History:  Diagnosis Date  . Anxiety   . Arthritis   . Bipolar disorder (HCC)   . Depression   . Gait disorder 12/17/2013  . GERD (gastroesophageal reflux disease)   . PTSD (post-traumatic stress disorder)    Past Surgical History:  Procedure Laterality Date  . HERNIA REPAIR  1990's  . KNEE SURGERY Right 1970's, 90's, 2011  . ORIF FINGER / THUMB FRACTURE    . SHOULDER SURGERY  2014  . TONSILLECTOMY  1970's  . WRIST SURGERY  1990's   Social History   Social History  . Marital status: Married    Spouse name: N/A  . Number of children: N/A  . Years of education: N/A   Social History Main Topics  . Smoking status: Never Smoker  . Smokeless tobacco: Never Used  . Alcohol use No  . Drug use: No  . Sexual activity: Not on file   Other Topics Concern  . Not on file   Social History Narrative  . No narrative on file   Family History  Problem Relation Age of Onset  . Heart failure Father   . Cancer Father   . COPD Mother   . Cancer Sister    Allergies  Allergen Reactions  . Wellbutrin [Bupropion] Photosensitivity and Anxiety   Prior to Admission medications   Medication Sig Start Date End Date Taking? Authorizing Provider  aspirin 325 MG tablet Take 650 mg by mouth daily as needed for moderate pain.   Yes  Historical Provider, MD  atorvastatin (LIPITOR) 10 MG tablet Take 1 tablet by mouth daily at 6 PM.  11/25/13  Yes Historical Provider, MD  clonazePAM (KLONOPIN) 1 MG tablet Take 2 tablets by mouth daily. 10/05/13  Yes Historical Provider, MD  FLUoxetine (PROZAC) 40 MG capsule Take 2 capsules by mouth daily. 12/08/13  Yes Historical Provider, MD  gabapentin (NEURONTIN) 400 MG capsule Take 800 mg by mouth 3 (three) times daily. 07/12/16  Yes Historical Provider, MD  lamoTRIgine (LAMICTAL) 200 MG tablet Take 1 tablet by mouth at bedtime.  12/08/13  Yes Historical Provider, MD  Omega-3 Fatty Acids (FISH OIL PO) Take 1 capsule by mouth daily.   Yes Historical Provider, MD  omeprazole (PRILOSEC) 20 MG capsule Take 1 capsule by mouth daily as needed (heartburn).  10/23/13  Yes Historical Provider, MD  QUEtiapine (SEROQUEL) 300 MG tablet Take 300 mg by mouth at bedtime.  12/08/13  Yes Historical Provider, MD  testosterone (ANDROGEL) 50 MG/5GM (1%) GEL Place 5 g onto the skin daily.  12/08/13  Yes Historical Provider, MD  VIAGRA 100 MG tablet Take 1 tablet by mouth as needed for erectile dysfunction.  12/13/13  Yes Historical Provider, MD  zolpidem (AMBIEN) 10 MG tablet Take 5 mg by mouth at bedtime.  12/08/13  Yes Historical Provider, MD  oxyCODONE-acetaminophen (PERCOCET/ROXICET) 5-325 MG per tablet Take 1 tablet by mouth 2 (  two) times daily as needed. 12/14/13   Historical Provider, MD     Positive ROS: All other systems have been reviewed and were otherwise negative with the exception of those mentioned in the HPI and as above.  Physical Exam: General: Alert, no acute distress Cardiovascular: No pedal edema Respiratory: No cyanosis, no use of accessory musculature GI: No organomegaly, abdomen is soft and non-tender Skin: No lesions in the area of chief complaint Neurologic: Sensation intact distally Psychiatric: Patient is competent for consent with normal mood and affect Lymphatic: No axillary or cervical  lymphadenopathy  MUSCULOSKELETAL: right knee with AROM 3-110 degrees, positive varus, crepitance, pain with ROM  Assessment: djd right knee   Plan: Plan for Procedure(s): RIGHT UNICOMPARTMENTAL KNEE  The risks benefits and alternatives were discussed with the patient including but not limited to the risks of nonoperative treatment, versus surgical intervention including infection, bleeding, nerve injury,  blood clots, cardiopulmonary complications, morbidity, mortality, among others, and they were willing to proceed.   Eulas PostLANDAU,Effie Janoski P, MD Cell [phone]   11/06/2016 9:57 AM

## 2016-11-07 ENCOUNTER — Encounter (HOSPITAL_COMMUNITY): Payer: Self-pay | Admitting: Orthopedic Surgery

## 2016-11-07 DIAGNOSIS — M1731 Unilateral post-traumatic osteoarthritis, right knee: Secondary | ICD-10-CM | POA: Diagnosis not present

## 2016-11-07 DIAGNOSIS — M25561 Pain in right knee: Secondary | ICD-10-CM | POA: Diagnosis not present

## 2016-11-07 DIAGNOSIS — Z96659 Presence of unspecified artificial knee joint: Secondary | ICD-10-CM

## 2016-11-07 LAB — BASIC METABOLIC PANEL
Anion gap: 9 (ref 5–15)
BUN: 6 mg/dL (ref 6–20)
CO2: 26 mmol/L (ref 22–32)
CREATININE: 0.99 mg/dL (ref 0.61–1.24)
Calcium: 8.4 mg/dL — ABNORMAL LOW (ref 8.9–10.3)
Chloride: 99 mmol/L — ABNORMAL LOW (ref 101–111)
GFR calc Af Amer: >60 mL/min (ref >60)
GLUCOSE: 125 mg/dL — AB (ref 65–99)
Potassium: 3.9 mmol/L (ref 3.5–5.1)
SODIUM: 134 mmol/L — AB (ref 135–145)

## 2016-11-07 LAB — CBC
HCT: 35 % — ABNORMAL LOW (ref 39.0–52.0)
Hemoglobin: 11.5 g/dL — ABNORMAL LOW (ref 13.0–17.0)
MCH: 30.3 pg (ref 26.0–34.0)
MCHC: 32.9 g/dL (ref 30.0–36.0)
MCV: 92.3 fL (ref 78.0–100.0)
PLATELETS: 196 10*3/uL (ref 150–400)
RBC: 3.79 MIL/uL — ABNORMAL LOW (ref 4.22–5.81)
RDW: 12.9 % (ref 11.5–15.5)
WBC: 6.8 10*3/uL (ref 4.0–10.5)

## 2016-11-07 NOTE — Discharge Summary (Signed)
Physician Discharge Summary  Patient ID: Mark HancockDanny R Sheets MRN: [id number] DOB/AGE: 62/12/1955 62 y.o.  Admit date: 11/06/2016 Discharge date: 11/07/2016  Admission Diagnoses:  Primary localized osteoarthritis of right knee  Discharge Diagnoses:  Principal Problem:   Primary localized osteoarthritis of right knee Active Problems:   S/P total knee replacement, right   S/P total knee replacement   Past Medical History:  Diagnosis Date  . Anxiety   . Arthritis   . Bipolar disorder (HCC)   . Depression   . Gait disorder 12/17/2013  . GERD (gastroesophageal reflux disease)   . Primary localized osteoarthritis of right knee 11/06/2016  . PTSD (post-traumatic stress disorder)     Surgeries: Procedure(s): TOTAL KNEE ARTHROPLASTY, RIGHT on 11/06/2016   Consultants (if any):   Discharged Condition: Improved  Hospital Course: Mark HancockDanny R Swiney is an 61 y.o. male who was admitted 11/06/2016 with a diagnosis of Primary localized osteoarthritis of right knee and went to the operating room on 11/06/2016 and underwent the above named procedures.    He was given perioperative antibiotics:  Anti-infectives    Start     Dose/Rate Route Frequency Ordered Stop   11/06/16 1800  ceFAZolin (ANCEF) IVPB 2g/100 mL premix     2 g 200 mL/hr over 30 Minutes Intravenous Every 6 hours 11/06/16 1604 11/06/16 2355   11/06/16 1100  ceFAZolin (ANCEF) IVPB 2g/100 mL premix     2 g 200 mL/hr over 30 Minutes Intravenous To [facility] 11/05/16 0816 11/06/16 1155    .  He was given sequential compression devices, early ambulation, and xarelto for DVT prophylaxis.  He benefited maximally from the hospital stay and there were no complications.    Recent vital signs:  Vitals:   11/06/16 2217 11/07/16 0605  BP: 112/69 105/63  Pulse: 80 80  Resp: 17 17  Temp: 97.7 F (36.5 C) 98.8 F (37.1 C)    Recent laboratory studies:  Lab Results  Component Value Date   HGB 11.5 (L) 11/07/2016   HGB 14.8  10/25/2016   Lab Results  Component Value Date   WBC 6.8 11/07/2016   PLT 196 11/07/2016   No results found for: INR Lab Results  Component Value Date   NA 134 (L) 11/07/2016   K 3.9 11/07/2016   CL 99 (L) 11/07/2016   CO2 26 11/07/2016   BUN 6 11/07/2016   CREATININE 0.99 11/07/2016   GLUCOSE 125 (H) 11/07/2016    Discharge Medications:   Allergies as of 11/07/2016      Reactions   Wellbutrin [bupropion] Photosensitivity, Anxiety      Medication List    STOP taking these medications   aspirin 325 MG tablet   oxyCODONE-acetaminophen 5-325 MG tablet Commonly known as:  PERCOCET/ROXICET Replaced by:  oxyCODONE-acetaminophen 10-325 MG tablet     TAKE these medications   atorvastatin 10 MG tablet Commonly known as:  LIPITOR Take 1 tablet by mouth daily at 6 PM.   clonazePAM 1 MG tablet Commonly known as:  KLONOPIN Take 2 tablets by mouth daily.   FISH OIL PO Take 1 capsule by mouth daily.   FLUoxetine 40 MG capsule Commonly known as:  PROZAC Take 2 capsules by mouth daily.   gabapentin 400 MG capsule Commonly known as:  NEURONTIN Take 800 mg by mouth 3 (three) times daily.   lamoTRIgine 200 MG tablet Commonly known as:  LAMICTAL Take 1 tablet by mouth at bedtime.   omeprazole 20 MG capsule Commonly known as:  PRILOSEC Take 1 capsule by mouth daily as needed (heartburn).   ondansetron 4 MG tablet Commonly known as:  ZOFRAN Take 1 tablet (4 mg total) by mouth every 8 (eight) hours as needed for nausea or vomiting.   oxyCODONE-acetaminophen 10-325 MG tablet Commonly known as:  PERCOCET Take 1-2 tablets by mouth every 6 (six) hours as needed for pain. MAXIMUM TOTAL ACETAMINOPHEN DOSE IS 4000 MG PER DAY Replaces:  oxyCODONE-acetaminophen 5-325 MG tablet   QUEtiapine 300 MG tablet Commonly known as:  SEROQUEL Take 300 mg by mouth at bedtime.   rivaroxaban 10 MG Tabs tablet Commonly known as:  XARELTO Take 1 tablet (10 mg total) by mouth daily.    sennosides-docusate sodium 8.6-50 MG tablet Commonly known as:  SENOKOT-S Take 2 tablets by mouth daily.   testosterone 50 MG/5GM (1%) Gel Commonly known as:  ANDROGEL Place 5 g onto the skin daily.   tiZANidine 4 MG tablet Commonly known as:  ZANAFLEX Take 1 tablet (4 mg total) by mouth every 6 (six) hours as needed for muscle spasms.   VIAGRA 100 MG tablet Generic drug:  sildenafil Take 1 tablet by mouth as needed for erectile dysfunction.   zolpidem 10 MG tablet Commonly known as:  AMBIEN Take 5 mg by mouth at bedtime.            Durable Medical Equipment        Start     Ordered   11/06/16 1605  DME Walker rolling  Once    Question:  Patient needs a walker to treat with the following condition  Answer:  S/P total knee replacement, right   11/06/16 1604   11/06/16 1605  DME 3 n 1  Once     11/06/16 1604   11/06/16 1605  DME Bedside commode  Once    Question:  Patient needs a bedside commode to treat with the following condition  Answer:  S/P total knee replacement, right   11/06/16 1604      Diagnostic Studies: Dg Knee Right Port  Result Date: 11/06/2016 CLINICAL DATA:  Postop day 0 right total knee arthroplasty. EXAM: PORTABLE RIGHT KNEE - 1-2 VIEW COMPARISON:  None. 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 No acute complicating features. IMPRESSION: Anatomic alignment post right total knee arthroplasty without acute complicating features. Electronically Signed   By: Hulan Saas M.D.   On: 11/06/2016 15:39    Disposition: home    Follow-up Information    Nathaneil Feagans P, MD. Schedule an appointment as soon as possible for a visit in 2 weeks.   Specialty:  Orthopedic Surgery Contact information: [address] [place] [address] [phone]            Signed: Eulas Post 11/07/2016, 9:15 AM

## 2016-11-07 NOTE — Care Management Note (Signed)
Case Management Note  Patient Details  Name: Mark Hull MRN: [id number] Date of Birth: 01/20/1956  Subjective/Objective:  61 yr old gentleman s/p right total knee arthroplasty.                   Action/Plan: Case manager spoke with patient and his wife concerning his discharge plan and DME needs. Patient has progressed well and will go directly to outpatient therapy. This was arranged prior to surgery at [facility] in [place], [place].    Expected Discharge Date:  11/07/16               Expected Discharge Plan:  Home/Self Care (going to [facility] for Outpatient therapy) In-House Referral:  NA  Discharge planning Services  CM Consult  Post Acute Care Choice:  Durable Medical Equipment, Home Health Choice offered to:  Patient  DME Arranged:  3-N-1, Walker rolling DME Agency:  [facility]  HH Arranged:  NA HH Agency:  NA  Status of Service:  Completed, signed off  If discussed at Long Length of Stay Meetings, dates discussed:    Additional Comments:  Mark Hull, Mark Rudnick [name], RN 11/07/2016, 12:07 PM

## 2016-11-07 NOTE — Discharge Instructions (Signed)
INSTRUCTIONS AFTER JOINT REPLACEMENT  ° °o Remove items at home which could result in a fall. This includes throw rugs or furniture in walking pathways °o ICE to the affected joint every three hours while awake for 30 minutes at a time, for at least the first 3-5 days, and then as needed for pain and swelling.  Continue to use ice for pain and swelling. You may notice swelling that will progress down to the foot and ankle.  This is normal after surgery.  Elevate your leg when you are not up walking on it.   °o Continue to use the breathing machine you got in the hospital (incentive spirometer) which will help keep your temperature down.  It is common for your temperature to cycle up and down following surgery, especially at night when you are not up moving around and exerting yourself.  The breathing machine keeps your lungs expanded and your temperature down. ° ° °DIET:  As you were doing prior to hospitalization, we recommend a well-balanced diet. ° °DRESSING / WOUND CARE / SHOWERING ° °You may change your dressing 3-5 days after surgery.  Then change the dressing every day with sterile gauze.  Please use good hand washing techniques before changing the dressing.  Do not use any lotions or creams on the incision until instructed by your surgeon. ° °ACTIVITY ° °o Increase activity slowly as tolerated, but follow the weight bearing instructions below.   °o No driving for 6 weeks or until further direction given by your physician.  You cannot drive while taking narcotics.  °o No lifting or carrying greater than 10 lbs. until further directed by your surgeon. °o Avoid periods of inactivity such as sitting longer than an hour when not asleep. This helps prevent blood clots.  °o You may return to work once you are authorized by your doctor.  ° ° ° °WEIGHT BEARING  ° °Weight bearing as tolerated with assist device (walker, cane, etc) as directed, use it as long as suggested by your surgeon or therapist, typically at  least 4-6 weeks. ° ° °EXERCISES ° °Results after joint replacement surgery are often greatly improved when you follow the exercise, range of motion and muscle strengthening exercises prescribed by your doctor. Safety measures are also important to protect the joint from further injury. Any time any of these exercises cause you to have increased pain or swelling, decrease what you are doing until you are comfortable again and then slowly increase them. If you have problems or questions, call your caregiver or physical therapist for advice.  ° °Rehabilitation is important following a joint replacement. After just a few days of immobilization, the muscles of the leg can become weakened and shrink (atrophy).  These exercises are designed to build up the tone and strength of the thigh and leg muscles and to improve motion. Often times heat used for twenty to thirty minutes before working out will loosen up your tissues and help with improving the range of motion but do not use heat for the first two weeks following surgery (sometimes heat can increase post-operative swelling).  ° °These exercises can be done on a training (exercise) mat, on the floor, on a table or on a bed. Use whatever works the best and is most comfortable for you.    Use music or television while you are exercising so that the exercises are a pleasant break in your day. This will make your life better with the exercises acting as a break   in your routine that you can look forward to.   Perform all exercises about fifteen times, three times per day or as directed.  You should exercise both the operative leg and the other leg as well. ° °Exercises include: °  °• Quad Sets - Tighten up the muscle on the front of the thigh (Quad) and hold for 5-10 seconds.   °• Straight Leg Raises - With your knee straight (if you were given a brace, keep it on), lift the leg to 60 degrees, hold for 3 seconds, and slowly lower the leg.  Perform this exercise against  resistance later as your leg gets stronger.  °• Leg Slides: Lying on your back, slowly slide your foot toward your buttocks, bending your knee up off the floor (only go as far as is comfortable). Then slowly slide your foot back down until your leg is flat on the floor again.  °• Angel Wings: Lying on your back spread your legs to the side as far apart as you can without causing discomfort.  °• Hamstring Strength:  Lying on your back, push your heel against the floor with your leg straight by tightening up the muscles of your buttocks.  Repeat, but this time bend your knee to a comfortable angle, and push your heel against the floor.  You may put a pillow under the heel to make it more comfortable if necessary.  ° °A rehabilitation program following joint replacement surgery can speed recovery and prevent re-injury in the future due to weakened muscles. Contact your doctor or a physical therapist for more information on knee rehabilitation.  ° ° °CONSTIPATION ° °Constipation is defined medically as fewer than three stools per week and severe constipation as less than one stool per week.  Even if you have a regular bowel pattern at home, your normal regimen is likely to be disrupted due to multiple reasons following surgery.  Combination of anesthesia, postoperative narcotics, change in appetite and fluid intake all can affect your bowels.  ° °YOU MUST use at least one of the following options; they are listed in order of increasing strength to get the job done.  They are all available over the counter, and you may need to use some, POSSIBLY even all of these options:   ° °Drink plenty of fluids (prune juice may be helpful) and high fiber foods °Colace 100 mg by mouth twice a day  °Senokot for constipation as directed and as needed Dulcolax (bisacodyl), take with full glass of water  °Miralax (polyethylene glycol) once or twice a day as needed. ° °If you have tried all these things and are unable to have a bowel  movement in the first 3-4 days after surgery call either your surgeon or your primary doctor.   ° °If you experience loose stools or diarrhea, hold the medications until you stool forms back up.  If your symptoms do not get better within 1 week or if they get worse, check with your doctor.  If you experience "the worst abdominal pain ever" or develop nausea or vomiting, please contact the office immediately for further recommendations for treatment. ° ° °ITCHING:  If you experience itching with your medications, try taking only a single pain pill, or even half a pain pill at a time.  You can also use Benadryl over the counter for itching or also to help with sleep.  ° °TED HOSE STOCKINGS:  Use stockings on both legs until for at least 2 weeks or as   directed by physician office. They may be removed at night for sleeping. ° °MEDICATIONS:  See your medication summary on the “After Visit Summary” that nursing will review with you.  You may have some home medications which will be placed on hold until you complete the course of blood thinner medication.  It is important for you to complete the blood thinner medication as prescribed. ° °PRECAUTIONS:  If you experience chest pain or shortness of breath - call 911 immediately for transfer to the hospital emergency department.  ° °If you develop a fever greater that 101 F, purulent drainage from wound, increased redness or drainage from wound, foul odor from the wound/dressing, or calf pain - CONTACT YOUR SURGEON.   °                                                °FOLLOW-UP APPOINTMENTS:  If you do not already have a post-op appointment, please call the office for an appointment to be seen by your surgeon.  Guidelines for how soon to be seen are listed in your “After Visit Summary”, but are typically between 1-4 weeks after surgery. ° ° °MAKE SURE YOU:  °• Understand these instructions.  °• Get help right away if you are not doing well or get worse.  ° ° °Thank you for  letting us be a part of your medical care team.  It is a privilege we respect greatly.  We hope these instructions will help you stay on track for a fast and full recovery!  ° ° ° °Information on my medicine - XARELTO® (Rivaroxaban) ° °This medication education was reviewed with me or my healthcare representative as part of my discharge preparation.  The pharmacist that spoke with me during my hospital stay was:  Anhelica Fowers Dien, RPH ° °Why was Xarelto® prescribed for you? °Xarelto® was prescribed for you to reduce the risk of blood clots forming after orthopedic surgery. The medical term for these abnormal blood clots is venous thromboembolism (VTE). ° °What do you need to know about xarelto® ? °Take your Xarelto® ONCE DAILY at the same time every day. °You may take it either with or without food. ° °If you have difficulty swallowing the tablet whole, you may crush it and mix in applesauce just prior to taking your dose. ° °Take Xarelto® exactly as prescribed by your doctor and DO NOT stop taking Xarelto® without talking to the doctor who prescribed the medication.  Stopping without other VTE prevention medication to take the place of Xarelto® may increase your risk of developing a clot. ° °After discharge, you should have regular check-up appointments with your healthcare provider that is prescribing your Xarelto®.   ° °What do you do if you miss a dose? °If you miss a dose, take it as soon as you remember on the same day then continue your regularly scheduled once daily regimen the next day. Do not take two doses of Xarelto® on the same day.  ° °Important Safety Information °A possible side effect of Xarelto® is bleeding. You should call your healthcare provider right away if you experience any of the following: °? Bleeding from an injury or your nose that does not stop. °? Unusual colored urine (red or dark brown) or unusual colored stools (red or black). °? Unusual bruising for unknown reasons. °? A serious  fall   or if you hit your head (even if there is no bleeding). ° °Some medicines may interact with Xarelto® and might increase your risk of bleeding while on Xarelto®. To help avoid this, consult your healthcare provider or pharmacist prior to using any new prescription or non-prescription medications, including herbals, vitamins, non-steroidal anti-inflammatory drugs (NSAIDs) and supplements. ° °This website has more information on Xarelto®: www.xarelto.com. ° ° ° ° °

## 2016-11-07 NOTE — Progress Notes (Signed)
IV removed from right hand. Catheter tip intact and site unremarkable.2x2 and tape applied.

## 2016-11-07 NOTE — Evaluation (Addendum)
Occupational Therapy Evaluation and Discharge Patient Details Name: Mark HancockDanny R Hull MRN: [id number] DOB: 01/01/1956 Today's Date: 11/07/2016    History of Present Illness pt is a 61 y/o male with pmh of bipolar d/o, PTSD, admitted with R knee dysfunction s/p R TKA.   Clinical Impression   PTA Pt independent in ADL and mobility. Pt currently Mod A for LB ADL and min guard for mobility with RW. Wife present for session, and will be returning to work tomorrow. 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 OT went over adaptations and compensatory strategies for ADL and safety including use of 3 in 1. Education complete, Pt and wife with no questions or concerns at the end of the session. OT to sign off, thank you for this referral.     Follow Up Recommendations  No OT follow up;Supervision - Intermittent    Equipment Recommendations  3 in 1 bedside commode ((arrived in room during session))    Recommendations for Other Services       Precautions / Restrictions Precautions Precautions: Knee Precaution Booklet Issued: Yes (comment) Precaution Comments: HEP by PT previously, educated in no pillow under knee Restrictions Weight Bearing Restrictions: Yes RLE Weight Bearing: Weight bearing as tolerated      Mobility Bed Mobility Overal bed mobility: Modified Independent             General bed mobility comments: use of bed rails  Transfers Overall transfer level: Needs assistance Equipment used: Rolling walker (2 wheeled) Transfers: Sit to/from Stand Sit to Stand: Supervision              Balance Overall balance assessment: Needs assistance Sitting-balance support: No upper extremity supported;Feet supported Sitting balance-Leahy Scale: Normal     Standing balance support: No upper extremity supported;During functional activity Standing balance-Leahy Scale: Fair Standing balance comment: able to perform standing dressing with no LOB                             ADL Overall ADL's : Needs assistance/impaired Eating/Feeding: Set up;Sitting   Grooming: Min guard;Standing   Upper Body Bathing: Min guard   Lower Body Bathing: Moderate assistance;With caregiver independent assisting   Upper Body Dressing : Set up;Sitting Upper Body Dressing Details (indicate cue type and reason): donned shirt EOB Lower Body Dressing: Moderate assistance;With caregiver independent assisting;Sit to/from stand Lower Body Dressing Details (indicate cue type and reason): donned udnerwear, pants sit to stand from EOB Toilet Transfer: Min guard;RW           Functional mobility during ADLs: Min guard;Rolling walker General ADL Comments: Pt's wife willing and able to assist      Vision Baseline Vision/History: Wears glasses Patient Visual Report: No change from baseline Vision Assessment?: No apparent visual deficits     Perception     Praxis      Pertinent Vitals/Pain Pain Assessment: Faces Faces Pain Scale: Hurts little more Pain Location: Right knee and thigh Pain Descriptors / Indicators: Sore Pain Intervention(s): Limited activity within patient's tolerance;Monitored during session;Repositioned;Ice applied     Hand Dominance     Extremity/Trunk Assessment Upper Extremity Assessment Upper Extremity Assessment: Overall WFL for tasks assessed   Lower Extremity Assessment Lower Extremity Assessment: RLE deficits/detail RLE Deficits / Details: deficits in ROM and strength post-op       Communication Communication Communication: No difficulties   Cognition Arousal/Alertness: Awake/alert Behavior During Therapy: WFL for tasks  assessed/performed Overall Cognitive Status: Within Functional Limits for tasks assessed                     General Comments  wife present for session    Exercises       Shoulder Instructions      Home Living Family/patient expects to be discharged to:: Private  residence Living Arrangements: Spouse/significant other Available Help at Discharge: Family;Available PRN/intermittently Type of Home: House Home Access: Level entry     Home Layout: Two level;Bed/bath upstairs Alternate Level Stairs-Number of Steps: flight Alternate Level Stairs-Rails: Right Bathroom Shower/Tub: Walk-in shower;Tub/shower unit   Teacher, early years/pre: Yes How Accessible: Accessible via walker Home Equipment: Crutches          Prior Functioning/Environment Level of Independence: Independent                 OT Problem List: Decreased strength;Decreased range of motion;Decreased activity tolerance;Impaired balance (sitting and/or standing);Decreased knowledge of use of DME or AE;Pain      OT Treatment/Interventions:      OT Goals(Current goals can be found in the care plan section) Acute Rehab OT Goals Patient Stated Goal: to be independent again OT Goal Formulation: With patient Time For Goal Achievement: 11/14/16 Potential to Achieve Goals: Good  OT Frequency:     Barriers to D/C:            Co-evaluation              End of Session Equipment Utilized During Treatment: Rolling walker Nurse Communication: Mobility status;Weight bearing status  Activity Tolerance: Patient tolerated treatment well Patient left: in chair;with call bell/phone within reach;with family/visitor present  OT Visit Diagnosis: Other abnormalities of gait and mobility (R26.89)                ADL either performed or assessed with clinical judgement  Time: 1138-1204 OT Time Calculation (min): 26 min Charges:  OT General Charges $OT Visit: 1 Procedure OT Evaluation $OT Eval Moderate Complexity: 1 Procedure OT Treatments $Self Care/Home Management : 8-22 mins G-Codes:     Sherryl Manges OTR/L [phone]  Mark Hull 2016/12/03, 6:13 PM   Addendum: Late G Codes   12/03/16 1804  OT G-codes **NOT FOR INPATIENT CLASS**   Functional Assessment Tool Used AM-PAC 6 Clicks Daily Activity  Functional Limitation Self care  Self Care Current Status (Z6109) CJ  Self Care Goal Status (U0454) CI  Self Care Discharge Status (U9811) CJ

## 2016-11-07 NOTE — Progress Notes (Signed)
Patient ID: Mark Hull, male   DOB: 03/02/1956, 61 y.o.   MRN: [id number]     Subjective:  Patient reports pain as mild.  Patient in bed and in no acute distress.  Denies any CP or SOB.  Patient asking to go home today  Objective:   VITALS:   Vitals:   11/06/16 1535 11/06/16 1550 11/06/16 2217 11/07/16 0605  BP: 129/88 119/83 112/69 105/63  Pulse: 80 79 80 80  Resp: 15 17 17 17   Temp:  98 F (36.7 C) 97.7 F (36.5 C) 98.8 F (37.1 C)  TempSrc:   Oral Oral  SpO2: 94% 98% 96% 90%    ABD soft Sensation intact distally Dorsiflexion/Plantar flexion intact Incision: dressing C/D/I and no drainage Good foot and ankle motion  Lab Results  Component Value Date   WBC 6.8 11/07/2016   HGB 11.5 (L) 11/07/2016   HCT 35.0 (L) 11/07/2016   MCV 92.3 11/07/2016   PLT 196 11/07/2016   BMET    Component Value Date/Time   NA 134 (L) 11/07/2016 0533   K 3.9 11/07/2016 0533   CL 99 (L) 11/07/2016 0533   CO2 26 11/07/2016 0533   GLUCOSE 125 (H) 11/07/2016 0533   BUN 6 11/07/2016 0533   CREATININE 0.99 11/07/2016 0533   CALCIUM 8.4 (L) 11/07/2016 0533   GFRNONAA >60 11/07/2016 0533   GFRAA >60 11/07/2016 0533     Assessment/Plan: 1 Day Post-Op   Principal Problem:   Primary localized osteoarthritis of right knee Active Problems:   S/P total knee replacement, right   Advance diet Up with therapy Discharge home with home health WBAT Dry dressing PRN Follow up in two weeks with Dr Dion SaucierLandau as scheduled   Torrie MayersUGLAS PARRY, BRANDON 11/07/2016, 7:45 AM  Agree with above.  Will place in obs and dc home today.   Teryl LucyJoshua Adessa Primiano, MD Cell [phone]

## 2016-11-07 NOTE — Progress Notes (Signed)
Physical Therapy Treatment Patient Details Name: Mark Hull MRN: [id number] DOB: May 23, 1956 Today's Date: 11/07/2016    History of Present Illness pt is a 61 y/o male with pmh of bipolar d/o, PTSD, admitted with R knee dysfunction s/p R TKA.    PT Comments    Patient is progressing toward mobility goals. Limited by being very drowsy this am however does still mobilize well. Patient needs to practice stairs next session.  Continue to progress as tolerated with anticipated d/c home with HHPT.   Follow Up Recommendations  Home health PT     Equipment Recommendations  Rolling walker with 5" wheels (or crutches)    Recommendations for Other Services       Precautions / Restrictions Precautions Precautions: Knee Precaution Booklet Issued: Yes (comment) Restrictions Weight Bearing Restrictions: Yes RLE Weight Bearing: Weight bearing as tolerated    Mobility  Bed Mobility Overal bed mobility: Modified Independent                Transfers Overall transfer level: Needs assistance   Transfers: Sit to/from Stand Sit to Stand: Supervision;Min guard         General transfer comment: supervision for safety first trial from EOB; pt had LOB backwards and sat down EOB; pt reported no dizziness and that he is just really tired; min guard next trial for safety however pt was steady  Ambulation/Gait Ambulation/Gait assistance: Min guard Ambulation Distance (Feet): 200 Feet Assistive device: Rolling walker (2 wheeled) Gait Pattern/deviations: Step-through pattern     General Gait Details: steady gait with little reliance on RW; cues for keeping eyes open   Stairs            Wheelchair Mobility    Modified Rankin (Stroke Patients Only)       Balance Overall balance assessment: Needs assistance   Sitting balance-Leahy Scale: Normal       Standing balance-Leahy Scale: Fair                      Cognition Arousal/Alertness: Awake/alert (pt very  drowsy) Behavior During Therapy: WFL for tasks assessed/performed Overall Cognitive Status: Within Functional Limits for tasks assessed                      Exercises Total Joint Exercises Quad Sets: AROM;Both;10 reps;Supine Heel Slides: AROM;10 reps;Right;Supine    General Comments General comments (skin integrity, edema, etc.): wife present for session      Pertinent Vitals/Pain Pain Assessment: Faces Faces Pain Scale: Hurts a little bit Pain Descriptors / Indicators: Sore Pain Intervention(s): Monitored during session;Premedicated before session;Repositioned    Home Living                      Prior Function            PT Goals (current goals can now be found in the care plan section) Acute Rehab PT Goals Patient Stated Goal: independent PT Goal Formulation: With patient Time For Goal Achievement: 11/13/16 Potential to Achieve Goals: Good Progress towards PT goals: Progressing toward goals    Frequency    7X/week      PT Plan Current plan remains appropriate    Co-evaluation             End of Session Equipment Utilized During Treatment: Gait belt Activity Tolerance: Patient tolerated treatment well Patient left: with call bell/phone within reach;with family/visitor present;in bed Nurse Communication: Mobility status PT Visit Diagnosis:  Other abnormalities of gait and mobility (R26.89);Pain Pain - Right/Left: Right Pain - part of body: Knee     Time: [id number] PT Time Calculation (min) (ACUTE ONLY): 28 min  Charges:  $Gait Training: 8-22 mins $Therapeutic Exercise: 8-22 mins                    G Codes:       Derek MoundKellyn R Merita Hawks Esme Freund, PTA Pager: [phone]   11/07/2016, 1:08 PM

## 2016-11-19 NOTE — Anesthesia Postprocedure Evaluation (Addendum)
Anesthesia Post Note  Patient: Mark Hull  Procedure(s) Performed: Procedure(s) (LRB): TOTAL KNEE ARTHROPLASTY, RIGHT (Right)  Patient location during evaluation: PACU Anesthesia Type: Spinal Level of consciousness: awake, awake and alert and oriented Pain management: pain level controlled Vital Signs Assessment: post-procedure vital signs reviewed and stable Respiratory status: spontaneous breathing, nonlabored ventilation and respiratory function stable Cardiovascular status: blood pressure returned to baseline Postop Assessment: spinal receding Anesthetic complications: no       Last Vitals:  Vitals:   11/06/16 2217 11/07/16 0605  BP: 112/69 105/63  Pulse: 80 80  Resp: 17 17  Temp: 36.5 C 37.1 C    Last Pain:  Vitals:   11/07/16 0605  TempSrc: Oral  PainSc:                  [name]

## 2016-11-22 NOTE — Progress Notes (Addendum)
Late Entry Addendum to the Initial Evaluation    11/07/16 1303  PT Time Calculation  PT Start Time (ACUTE ONLY) 0954  PT Stop Time (ACUTE ONLY) 1022  PT Time Calculation (min) (ACUTE ONLY) 28 min  PT G-Codes **NOT FOR INPATIENT CLASS**  Functional Assessment Tool Used AM-PAC 6 Clicks Basic Mobility;Clinical judgement  Functional Limitation Mobility: Walking and moving around  Mobility: Walking and Moving Around Current Status (V7846(G8978) CI  Mobility: Walking and Moving Around Goal Status (N6295(G8979) CH  PT General Charges  $$ ACUTE PT VISIT 1 Procedure  PT Treatments  $Gait Training 8-22 mins  $Therapeutic Exercise 8-22 mins   11/22/2016  [place] BingKen Derryck Hull, PT [phone]  (pager)

## 2017-04-18 NOTE — Addendum Note (Signed)
Addendum  created 04/18/17 1137 by Verley Pariseau, MD   Sign clinical note    

## 2017-12-20 ENCOUNTER — Ambulatory Visit (HOSPITAL_COMMUNITY): Payer: 59 | Admitting: Psychiatry

## 2018-01-18 ENCOUNTER — Ambulatory Visit (HOSPITAL_COMMUNITY): Payer: 59 | Admitting: Psychiatry

## 2018-05-12 DIAGNOSIS — F4310 Post-traumatic stress disorder, unspecified: Secondary | ICD-10-CM | POA: Insufficient documentation

## 2018-05-12 DIAGNOSIS — F4010 Social phobia, unspecified: Secondary | ICD-10-CM

## 2018-05-12 DIAGNOSIS — F319 Bipolar disorder, unspecified: Secondary | ICD-10-CM

## 2018-06-02 ENCOUNTER — Encounter: Payer: Self-pay | Admitting: Psychiatry

## 2018-06-02 ENCOUNTER — Ambulatory Visit (INDEPENDENT_AMBULATORY_CARE_PROVIDER_SITE_OTHER): Payer: Medicare Other | Admitting: Psychiatry

## 2018-06-02 DIAGNOSIS — F4310 Post-traumatic stress disorder, unspecified: Secondary | ICD-10-CM

## 2018-06-02 DIAGNOSIS — F4010 Social phobia, unspecified: Secondary | ICD-10-CM | POA: Diagnosis not present

## 2018-06-02 DIAGNOSIS — F3181 Bipolar II disorder: Secondary | ICD-10-CM

## 2018-06-02 DIAGNOSIS — R7989 Other specified abnormal findings of blood chemistry: Secondary | ICD-10-CM

## 2018-06-02 MED ORDER — METHYLPHENIDATE HCL ER (OSM) 54 MG PO TBCR: 54.0000 mg | EXTENDED_RELEASE_TABLET | ORAL | 0 refills | Status: DC

## 2018-06-02 NOTE — Patient Instructions (Signed)
Testosterone blood test At your earliest convenience.

## 2018-06-02 NOTE — Progress Notes (Signed)
[facility]  Patient ID: Mark Hull,  MRN: [id number]  PCP: Arlina Robes, MD  Date of Evaluation: 06/02/2018 Time spent:25 minutes   HISTORY/CURRENT STATUS: HPI Great cp to usual.  Better energy, motivation and activity.  More interests.  Most of benefit noted recently with Concerta added. Sleep good with meds.   Individual Medical History/ Review of Systems: Changes? :Yes chronic health issues.  Some arm pain with use.  Allergies: Wellbutrin [bupropion]  Current Medications:  Current Outpatient Medications:  .  atorvastatin (LIPITOR) 10 MG tablet, Take 1 tablet by mouth daily at 6 PM. , Disp: , Rfl:  .  clonazePAM (KLONOPIN) 1 MG tablet, Take 2 tablets by mouth daily., Disp: , Rfl:  .  FLUoxetine (PROZAC) 40 MG capsule, Take 2 capsules by mouth daily., Disp: , Rfl:  .  gabapentin (NEURONTIN) 400 MG capsule, Take 800 mg by mouth 3 (three) times daily., Disp: , Rfl: 0 .  lamoTRIgine (LAMICTAL) 200 MG tablet, Take 1 tablet by mouth at bedtime. , Disp: , Rfl:  .  methylphenidate 54 MG PO CR tablet, Take 1 tablet (54 mg total) by mouth every morning., Disp: 30 tablet, Rfl: 0 .  Omega-3 Fatty Acids (FISH OIL PO), Take 1 capsule by mouth daily., Disp: , Rfl:  .  QUEtiapine (SEROQUEL) 400 MG tablet, Take 400 mg by mouth at bedtime., Disp: , Rfl: 0 .  testosterone (ANDROGEL) 50 MG/5GM (1%) GEL, Place 5 g onto the skin daily. , Disp: , Rfl:  .  zolpidem (AMBIEN) 10 MG tablet, Take 5 mg by mouth at bedtime. , Disp: , Rfl:  .  omeprazole (PRILOSEC) 20 MG capsule, Take 1 capsule by mouth daily as needed (heartburn). , Disp: , Rfl:  .  ondansetron (ZOFRAN) 4 MG tablet, Take 1 tablet (4 mg total) by mouth every 8 (eight) hours as needed for nausea or vomiting. (Patient not taking: Reported on 06/02/2018), Disp: 30 tablet, Rfl: 0 .  oxyCODONE-acetaminophen (PERCOCET) 10-325 MG tablet, Take 1-2 tablets by mouth every 6 (six) hours as needed for pain. MAXIMUM TOTAL  ACETAMINOPHEN DOSE IS 4000 MG PER DAY (Patient not taking: Reported on 06/02/2018), Disp: 50 tablet, Rfl: 0 .  rivaroxaban (XARELTO) 10 MG TABS tablet, Take 1 tablet (10 mg total) by mouth daily. (Patient not taking: Reported on 06/02/2018), Disp: 21 tablet, Rfl: 0 .  sennosides-docusate sodium (SENOKOT-S) 8.6-50 MG tablet, Take 2 tablets by mouth daily., Disp: 30 tablet, Rfl: 1 .  tiZANidine (ZANAFLEX) 4 MG tablet, Take 1 tablet (4 mg total) by mouth every 6 (six) hours as needed for muscle spasms. (Patient not taking: Reported on 06/02/2018), Disp: 30 tablet, Rfl: 0 .  VIAGRA 100 MG tablet, Take 1 tablet by mouth as needed for erectile dysfunction. , Disp: , Rfl:  Medication Side Effects: None  Family Medical/ Social History: Changes? Yes Thinking of a litlle pt work.  Not capable of consistency needed for full time.  MENTAL HEALTH EXAM:  There were no vitals taken for this visit.There is no height or weight on file to calculate BMI.  General Appearance: Casual  Eye Contact:  Good  Speech:  Normal Rate  Volume:  Normal  Mood:  Anxious and Depressed  Affect:  Appropriate and Restricted  Thought Process:  Coherent and Goal Directed  Orientation:  Full (Time, Place, and Person)  Thought Content: WDL   Suicidal Thoughts:  No  Homicidal Thoughts:  No  Memory:  Recent  Judgement:  Good  Insight:  Good  Psychomotor Activity:  Normal and Decreased  Concentration:  Concentration: Good  Recall:  Good  Fund of Knowledge: Good  Language: Good  Akathisia:  No  AIMS (if indicated): not done  Assets:  Desire for Improvement Leisure Time Social Support  ADL's:  Intact  Cognition: WNL  Prognosis:  Fair    DIAGNOSES:    ICD-10-CM   1. Bipolar II disorder (HCC) F31.81   2. Social anxiety disorder F40.10   3. PTSD (post-traumatic stress disorder) F43.10   4. Low testosterone R79.89 Testosterone    RECOMMENDATIONS:  Disc risks of stimulants including abuse, SE, triggering mania.  Call if  these develop. Disc low T as risk factor for depression. Need level.  Discussed risk of testosterone.  Disc risk of polypharmacy which is medically necessary and helpful FU 4 months.  Call prn.    Lauraine Rinne, MD

## 2018-06-09 ENCOUNTER — Other Ambulatory Visit: Payer: Self-pay | Admitting: Psychiatry

## 2018-06-09 NOTE — Progress Notes (Signed)
[facility]  Wrote RX Concerta 54 mg #30 1 daily. No refill

## 2018-06-17 ENCOUNTER — Other Ambulatory Visit: Payer: Self-pay

## 2018-06-17 MED ORDER — QUETIAPINE FUMARATE 400 MG PO TABS: 400.0000 mg | ORAL_TABLET | Freq: Every day | ORAL | 1 refills | Status: DC

## 2018-06-20 ENCOUNTER — Other Ambulatory Visit: Payer: Self-pay | Admitting: Psychiatry

## 2018-06-20 LAB — TESTOSTERONE: TESTOSTERONE: 394 ng/dL (ref 264–916)

## 2018-06-20 NOTE — Progress Notes (Signed)
Testosterone results 394 on supplement 50 mg daily.  Spoke with the pharmacist and called in prescription for 50 mg packets 1-1/2 packet daily number 90 days no refills. Target goal is at least middle of the normal range

## 2018-06-21 IMAGING — DX DG KNEE 1-2V PORT*R*
2 series · 2 of 2 positions shown · non-contrast
Comparison: None.

CLINICAL DATA: Postop day 0 right total knee arthroplasty.

EXAM:
PORTABLE RIGHT KNEE - 1-2 VIEW

[knee ap]
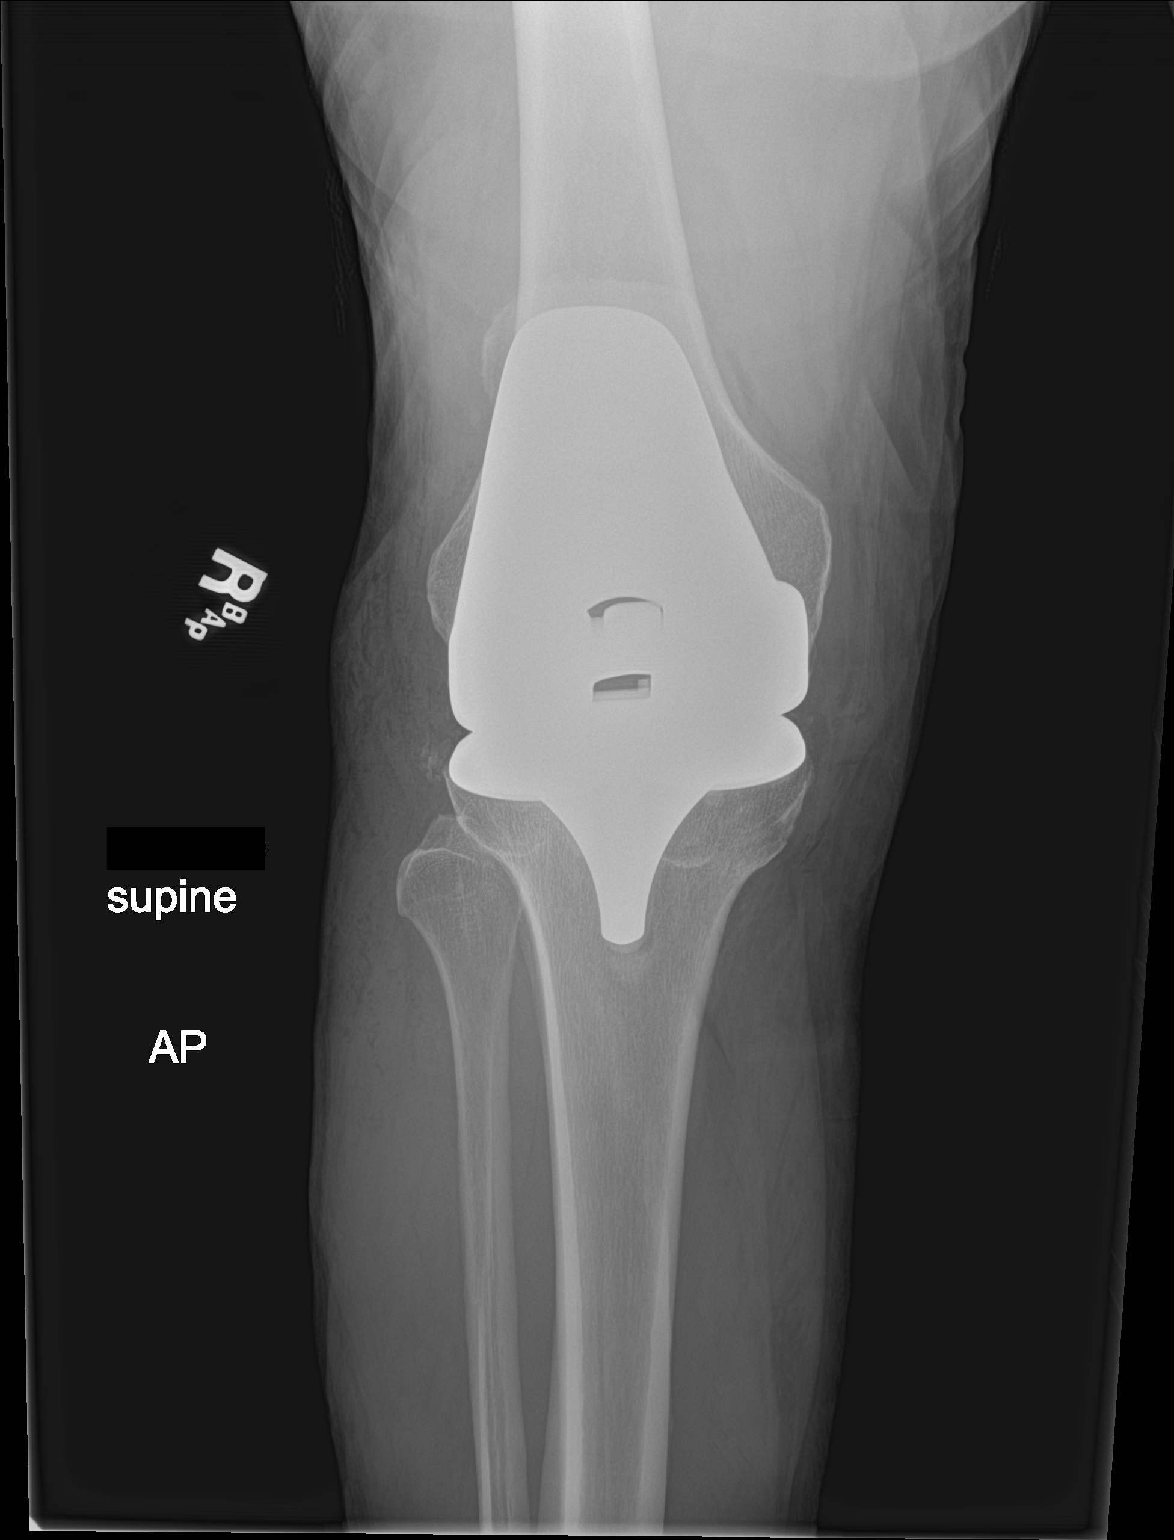

[knee lat]
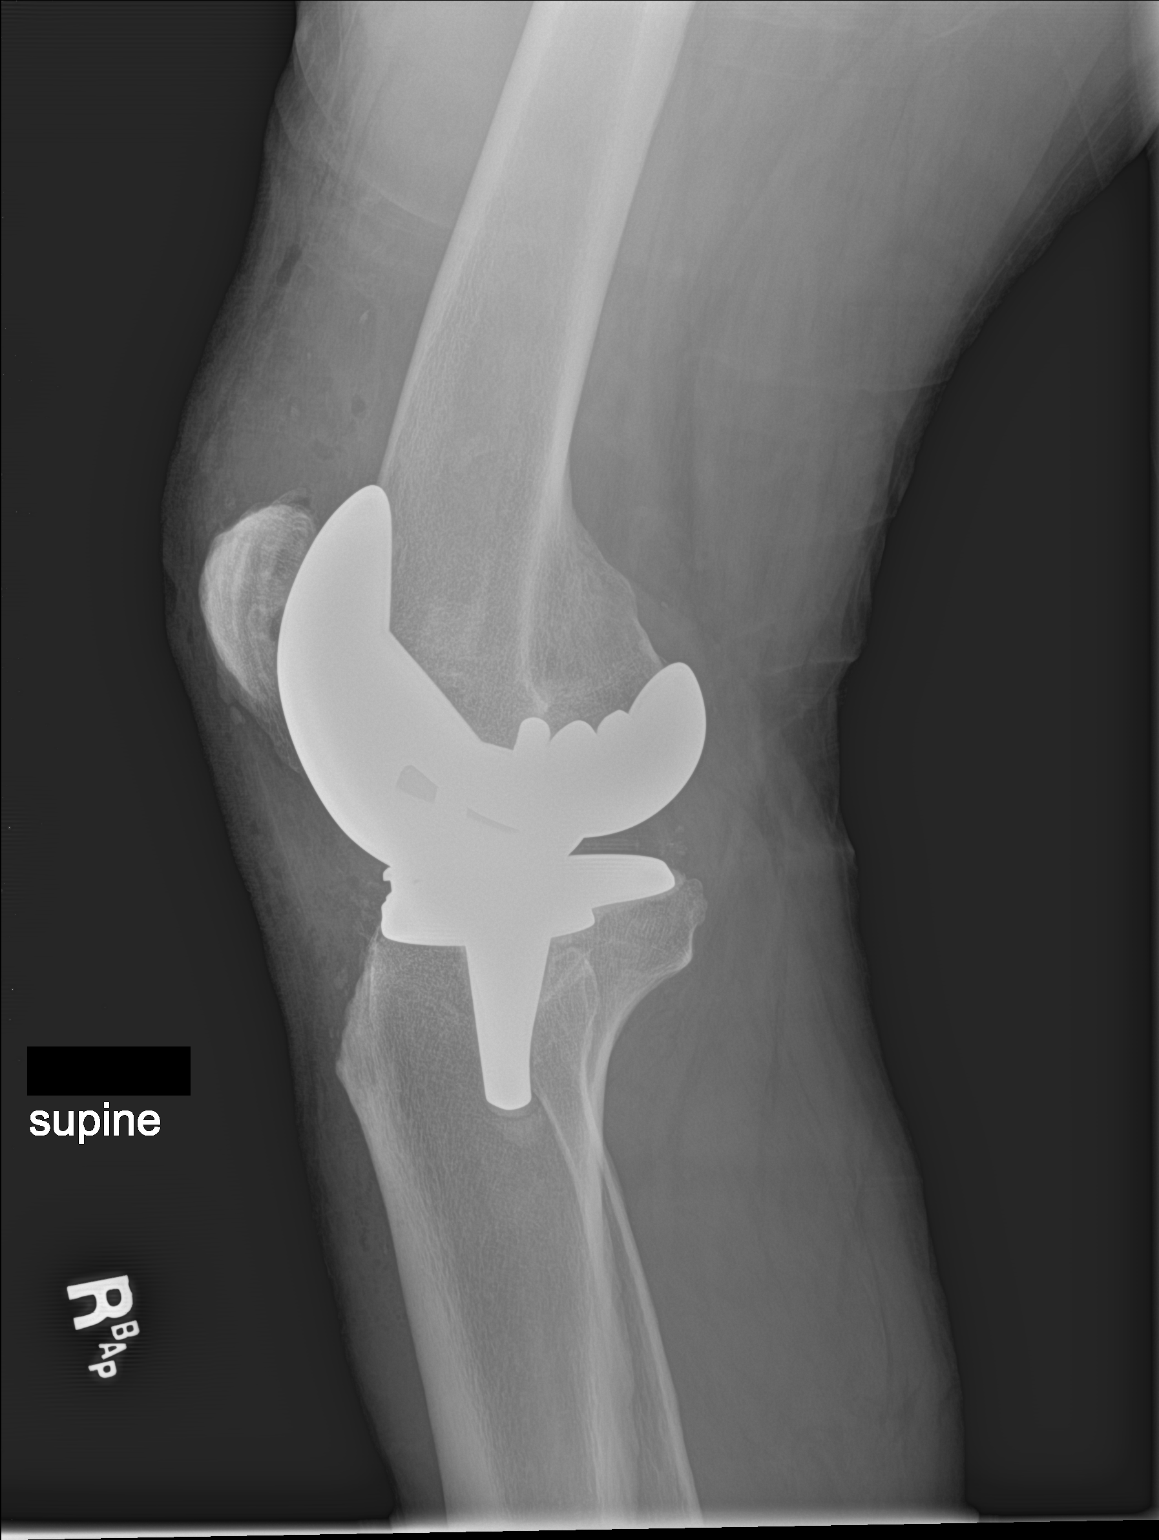

[2 of 2 positions shown; findings below may reference images not displayed]

FINDINGS: Anatomic alignment after right total knee arthroplasty. No acute
complicating features.
IMPRESSION: Anatomic alignment post right total knee arthroplasty without acute
complicating features.

## 2018-06-23 NOTE — Progress Notes (Signed)
Left VM for pt to call back.

## 2018-06-24 NOTE — Progress Notes (Signed)
Called pt and left voicemail for results

## 2018-06-27 NOTE — Progress Notes (Signed)
Still unable to reach pt, left voicemail with information.

## 2018-06-30 ENCOUNTER — Ambulatory Visit: Payer: 59 | Admitting: Psychiatry

## 2018-07-08 ENCOUNTER — Other Ambulatory Visit: Payer: Self-pay | Admitting: Psychiatry

## 2018-07-08 DIAGNOSIS — F900 Attention-deficit hyperactivity disorder, predominantly inattentive type: Secondary | ICD-10-CM

## 2018-07-08 MED ORDER — GABAPENTIN 400 MG PO CAPS: 800.0000 mg | ORAL_CAPSULE | Freq: Three times a day (TID) | ORAL | 3 refills | Status: DC

## 2018-07-08 MED ORDER — METHYLPHENIDATE HCL ER (OSM) 54 MG PO TBCR: 54.0000 mg | EXTENDED_RELEASE_TABLET | ORAL | 0 refills | Status: DC

## 2018-07-08 NOTE — Progress Notes (Signed)
Refill gabapentin and refill Concerta for 90 days.  We will we will wait and see if the 90-day refill is approved

## 2018-07-22 ENCOUNTER — Other Ambulatory Visit: Payer: Self-pay

## 2018-07-22 MED ORDER — GABAPENTIN 400 MG PO CAPS: 800.0000 mg | ORAL_CAPSULE | Freq: Three times a day (TID) | ORAL | 1 refills | Status: DC

## 2018-08-04 ENCOUNTER — Ambulatory Visit (INDEPENDENT_AMBULATORY_CARE_PROVIDER_SITE_OTHER): Payer: Medicare Other | Admitting: Psychiatry

## 2018-08-04 DIAGNOSIS — F341 Dysthymic disorder: Secondary | ICD-10-CM

## 2018-08-04 DIAGNOSIS — F4010 Social phobia, unspecified: Secondary | ICD-10-CM

## 2018-08-04 DIAGNOSIS — F900 Attention-deficit hyperactivity disorder, predominantly inattentive type: Secondary | ICD-10-CM | POA: Diagnosis not present

## 2018-08-04 DIAGNOSIS — F3181 Bipolar II disorder: Secondary | ICD-10-CM

## 2018-08-04 DIAGNOSIS — Z8659 Personal history of other mental and behavioral disorders: Secondary | ICD-10-CM
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
## 2018-08-22 ENCOUNTER — Other Ambulatory Visit: Payer: Self-pay

## 2018-08-22 MED ORDER — FLUOXETINE HCL 40 MG PO CAPS: 80.0000 mg | ORAL_CAPSULE | Freq: Every day | ORAL | 0 refills | Status: DC

## 2018-09-02 ENCOUNTER — Encounter: Payer: Self-pay | Admitting: Emergency Medicine

## 2018-09-04 ENCOUNTER — Telehealth: Payer: Self-pay | Admitting: Psychiatry

## 2018-09-04 ENCOUNTER — Other Ambulatory Visit: Payer: Self-pay | Admitting: Psychiatry

## 2018-09-04 DIAGNOSIS — F900 Attention-deficit hyperactivity disorder, predominantly inattentive type: Secondary | ICD-10-CM

## 2018-09-04 MED ORDER — METHYLPHENIDATE HCL ER (OSM) 54 MG PO TBCR: 54.0000 mg | EXTENDED_RELEASE_TABLET | ORAL | 0 refills | Status: DC

## 2018-09-04 NOTE — Progress Notes (Signed)
Concerta refill.

## 2018-09-04 NOTE — Telephone Encounter (Signed)
[facility] [address] [place], [place]

## 2018-09-04 NOTE — Telephone Encounter (Signed)
Patient requesting refill on Concerta 54mg . Stated he could pick up prescription on 1/13 appointment with AM or [facility].

## 2018-09-04 NOTE — Telephone Encounter (Signed)
Need to [facility]

## 2018-09-08 ENCOUNTER — Ambulatory Visit (INDEPENDENT_AMBULATORY_CARE_PROVIDER_SITE_OTHER): Payer: Medicare Other | Admitting: Psychiatry

## 2018-09-08 DIAGNOSIS — F3181 Bipolar II disorder: Secondary | ICD-10-CM

## 2018-09-08 DIAGNOSIS — F341 Dysthymic disorder: Secondary | ICD-10-CM

## 2018-09-08 DIAGNOSIS — F4010 Social phobia, unspecified: Secondary | ICD-10-CM | POA: Diagnosis not present
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
## 2018-09-14 ENCOUNTER — Other Ambulatory Visit: Payer: Self-pay | Admitting: Psychiatry

## 2018-09-15 NOTE — Telephone Encounter (Signed)
Need to review paper chart  

## 2018-10-03 ENCOUNTER — Other Ambulatory Visit: Payer: Self-pay | Admitting: Psychiatry

## 2018-10-06 ENCOUNTER — Ambulatory Visit (INDEPENDENT_AMBULATORY_CARE_PROVIDER_SITE_OTHER): Payer: Medicare Other | Admitting: Psychiatry

## 2018-10-06 ENCOUNTER — Ambulatory Visit: Payer: 59 | Admitting: Psychiatry

## 2018-10-06 DIAGNOSIS — F319 Bipolar disorder, unspecified: Secondary | ICD-10-CM

## 2018-10-06 DIAGNOSIS — F4010 Social phobia, unspecified: Secondary | ICD-10-CM | POA: Diagnosis not present

## 2018-10-06 DIAGNOSIS — Z8659 Personal history of other mental and behavioral disorders: Secondary | ICD-10-CM

## 2018-10-06 DIAGNOSIS — F341 Dysthymic disorder: Secondary | ICD-10-CM | POA: Diagnosis not present
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
## 2018-10-21 ENCOUNTER — Telehealth: Payer: Self-pay | Admitting: Psychiatry

## 2018-10-21 NOTE — Telephone Encounter (Signed)
[name] asks that we [facility] [place]

## 2018-10-22 ENCOUNTER — Other Ambulatory Visit: Payer: Self-pay

## 2018-10-22 MED ORDER — GABAPENTIN 400 MG PO CAPS: 800.0000 mg | ORAL_CAPSULE | Freq: Three times a day (TID) | ORAL | 0 refills | Status: DC

## 2018-10-22 NOTE — Telephone Encounter (Signed)
rx submitted to requested pharmacy

## 2018-10-22 NOTE — Progress Notes (Signed)
Pt called with refill request for gabapentin 800mg  tid. 90 day supply. Next office visit 10/29/2018 Last office visit 06/02/2018

## 2018-10-29 ENCOUNTER — Encounter: Payer: Self-pay | Admitting: Psychiatry

## 2018-10-29 ENCOUNTER — Ambulatory Visit (INDEPENDENT_AMBULATORY_CARE_PROVIDER_SITE_OTHER): Payer: Medicare Other | Admitting: Psychiatry

## 2018-10-29 DIAGNOSIS — F4310 Post-traumatic stress disorder, unspecified: Secondary | ICD-10-CM

## 2018-10-29 DIAGNOSIS — F341 Dysthymic disorder: Secondary | ICD-10-CM

## 2018-10-29 DIAGNOSIS — F900 Attention-deficit hyperactivity disorder, predominantly inattentive type: Secondary | ICD-10-CM

## 2018-10-29 DIAGNOSIS — F319 Bipolar disorder, unspecified: Secondary | ICD-10-CM | POA: Diagnosis not present

## 2018-10-29 DIAGNOSIS — R7989 Other specified abnormal findings of blood chemistry: Secondary | ICD-10-CM

## 2018-10-29 DIAGNOSIS — F4010 Social phobia, unspecified: Secondary | ICD-10-CM | POA: Diagnosis not present

## 2018-10-29 NOTE — Progress Notes (Signed)
Mark HancockDanny R Aker [id number] 11/06/1955 63 y.o.  Subjective:   Patient ID:  Mark HancockDanny R Touchette is a 63 y.o. (DOB 03/27/1956) male.  Chief Complaint:  Chief Complaint  Patient presents with  . Follow-up    Medication Management    HPI Mark HancockDanny R Harvill presents to the office today for follow-up of mood anxiety.  Last seen in October.  Doing very well overall.  Things improved with the Concerta added June 2019.  Level of depression is improved not gone.  Depression improved from 4/10 to 6/10.  Anxiety overall very good with occ spikes he manages with relaxation techniques.  Sleep is good.  Concerta helps energy.  No significant manic symptoms since here.  No irritability or agitation.  Minimal racing thoughts except when anxious.  Satisfied with the meds.  Insurance questioning gabapentin.  He's been on it since at least 2008 for mood and anxiety.  This is used off label for those conditions and has been for years to good effect.  It is also used for chronic knee pain and arthritic pain to good effect also.  He has a history of knee surgery.  His anxiety would worsen off this medication markedly.  It is safer than increasing clonazepam.  Review of Systems:  Review of Systems  Genitourinary: Positive for difficulty urinating.  Musculoskeletal: Positive for arthralgias.  Neurological: Negative for tremors and weakness.  Psychiatric/Behavioral: Positive for dysphoric mood. Negative for agitation, behavioral problems, confusion, decreased concentration, hallucinations, self-injury, sleep disturbance and suicidal ideas. The patient is nervous/anxious. The patient is not hyperactive.     Medications: I have reviewed the patient's current medications.  Current Outpatient Medications  Medication Sig Dispense Refill  . atorvastatin (LIPITOR) 10 MG tablet Take 1 tablet by mouth daily at 6 PM.     . clonazePAM (KLONOPIN) 1 MG tablet TAKE 1/2 TABLET TWICE A DAY AND TAKE 1 TABLT AT 4PM AND TAKE 1/2 TAB AS  NEEDED 225 tablet 0  . finasteride (PROSCAR) 5 MG tablet     . FLUoxetine (PROZAC) 40 MG capsule Take 2 capsules (80 mg total) by mouth daily. 180 capsule 0  . gabapentin (NEURONTIN) 400 MG capsule Take 2 capsules (800 mg total) by mouth 3 (three) times daily. 540 capsule 0  . lamoTRIgine (LAMICTAL) 200 MG tablet Take 1 tablet by mouth at bedtime.     . methylphenidate 54 MG PO CR tablet Take 1 tablet (54 mg total) by mouth every morning. 90 tablet 0  . Omega-3 Fatty Acids (FISH OIL PO) Take 1 capsule by mouth daily.    . QUEtiapine (SEROQUEL) 400 MG tablet Take 1 tablet (400 mg total) by mouth at bedtime. 180 tablet 1  . tamsulosin (FLOMAX) 0.4 MG CAPS capsule Take 0.4 mg by mouth daily.    Marland Kitchen. VIAGRA 100 MG tablet Take 1 tablet by mouth as needed for erectile dysfunction.     Marland Kitchen. zolpidem (AMBIEN) 10 MG tablet Take 5 mg by mouth at bedtime.      No current facility-administered medications for this visit.     Medication Side Effects: None  Allergies:  Allergies  Allergen Reactions  . Wellbutrin [Bupropion] Photosensitivity and Anxiety    Past Medical History:  Diagnosis Date  . Anxiety   . Arthritis   . Bipolar disorder (HCC)   . Depression   . Gait disorder 12/17/2013  . GERD (gastroesophageal reflux disease)   . Primary localized osteoarthritis of right knee 11/06/2016  . PTSD (post-traumatic stress disorder)  Family History  Problem Relation Age of Onset  . Heart failure Father   . Cancer Father   . COPD Mother   . Cancer Sister     Social History   Socioeconomic History  . Marital status: Married    Spouse name: Not on file  . Number of children: Not on file  . Years of education: Not on file  . Highest education level: Not on file  Occupational History  . Not on file  Social Needs  . Financial resource strain: Not on file  . Food insecurity:    Worry: Not on file    Inability: Not on file  . Transportation needs:    Medical: Not on file    Non-medical:  Not on file  Tobacco Use  . Smoking status: Never Smoker  . Smokeless tobacco: Never Used  Substance and Sexual Activity  . Alcohol use: No  . Drug use: No  . Sexual activity: Not on file  Lifestyle  . Physical activity:    Days per week: Not on file    Minutes per session: Not on file  . Stress: Not on file  Relationships  . Social connections:    Talks on phone: Not on file    Gets together: Not on file    Attends religious service: Not on file    Active member of club or organization: Not on file    Attends meetings of clubs or organizations: Not on file    Relationship status: Not on file  . Intimate partner violence:    Fear of current or ex partner: Not on file    Emotionally abused: Not on file    Physically abused: Not on file    Forced sexual activity: Not on file  Other Topics Concern  . Not on file  Social History Narrative  . Not on file    Past Medical History, Surgical history, Social history, and Family history were reviewed and updated as appropriate.   Please see review of systems for further details on the patient's review from today.   Objective:   Physical Exam:  There were no vitals taken for this visit.  Physical Exam Constitutional:      General: He is not in acute distress.    Appearance: He is well-developed.  Musculoskeletal:        General: No deformity.  Neurological:     Mental Status: He is alert and oriented to person, place, and time.     Coordination: Coordination normal.  Psychiatric:        Attention and Perception: Attention normal. He is attentive.        Mood and Affect: Mood is anxious and depressed. Affect is not labile, blunt, angry or inappropriate.        Speech: Speech normal.        Behavior: Behavior normal.        Thought Content: Thought content normal. Thought content does not include homicidal or suicidal ideation. Thought content does not include homicidal or suicidal plan.        Cognition and Memory:  Cognition normal.        Judgment: Judgment normal.     Comments: Insight is good. Depression and anxiety are much better than historically true but not resolved.     Lab Review:     Component Value Date/Time   NA 134 (L) 11/07/2016 0533   K 3.9 11/07/2016 0533   CL 99 (L) 11/07/2016 0533  CO2 26 11/07/2016 0533   GLUCOSE 125 (H) 11/07/2016 0533   BUN 6 11/07/2016 0533   CREATININE 0.99 11/07/2016 0533   CALCIUM 8.4 (L) 11/07/2016 0533   GFRNONAA >60 11/07/2016 0533   GFRAA >60 11/07/2016 0533       Component Value Date/Time   WBC 6.8 11/07/2016 0533   RBC 3.79 (L) 11/07/2016 0533   HGB 11.5 (L) 11/07/2016 0533   HCT 35.0 (L) 11/07/2016 0533   PLT 196 11/07/2016 0533   MCV 92.3 11/07/2016 0533   MCH 30.3 11/07/2016 0533   MCHC 32.9 11/07/2016 0533   RDW 12.9 11/07/2016 0533    No results found for: POCLITH, LITHIUM   No results found for: PHENYTOIN, PHENOBARB, VALPROATE, CBMZ   .res Assessment: Plan:    Bipolar depression (HCC)  PTSD (post-traumatic stress disorder)  Social anxiety disorder  Severe early onset dysthymic disorder, in partial remission, with anxious distress, with intermittent major depressive episodes, with current episode  Low testosterone  Attention deficit hyperactivity disorder (ADHD), predominantly inattentive type  Greater than 50% of face to face time with patient was spent on counseling and coordination of care. We discussed Patient has multiple psychiatric diagnoses as noted.  The last medicine change was the addition of Concerta in June 2019 which further reduced his depressive symptoms and improved his energy and productivity.  It is not ideal to be using that in combination with the benzodiazepines and he is aware of that and trying to minimize the amount of benzodiazepine.  He is tolerating his medications well.  He does not want medicine changes.  No medicine changes today  We discussed the short-term risks associated with  benzodiazepines including sedation and increased fall risk among others.  Discussed long-term side effect risk including dependence, potential withdrawal symptoms, and the potential eventual dose-related risk of dementia.  As noted above the patient gets benefit from gabapentin.  Discussed insurance problems with it and we will apparently have to do a prior authorization to continue it.  We both feel that it is helpful and the benefit outweighs any risks.  Additionally I am quite confident in his anxiety at least as well as his joint pain would worsen markedly off of the medication.  This is a 30-minute appointment  Follow-up 4 months  Meredith Staggers MD, DFAPA  Please see After Visit Summary for patient specific instructions.  Future Appointments  Date Time Provider Department Center  11/10/2018  1:00 PM Robley Fries, PhD [facility] None  03/02/2019  1:45 PM Cottle, Steva Ready., MD [facility] None    No orders of the defined types were placed in this encounter.     -------------------------------

## 2018-11-04 ENCOUNTER — Other Ambulatory Visit: Payer: Self-pay

## 2018-11-04 MED ORDER — QUETIAPINE FUMARATE 400 MG PO TABS: 800.0000 mg | ORAL_TABLET | Freq: Every day | ORAL | 0 refills | Status: DC

## 2018-11-04 MED ORDER — TESTOSTERONE 50 MG/5GM (1%) TD GEL: 5.0000 g | Freq: Every day | TRANSDERMAL | 5 refills | Status: DC

## 2018-11-04 MED ORDER — FLUOXETINE HCL 40 MG PO CAPS: 80.0000 mg | ORAL_CAPSULE | Freq: Every day | ORAL | 0 refills | Status: DC

## 2018-11-04 MED ORDER — CLONAZEPAM 1 MG PO TABS: ORAL_TABLET | ORAL | 1 refills | Status: DC

## 2018-11-04 MED ORDER — GABAPENTIN 400 MG PO CAPS: 800.0000 mg | ORAL_CAPSULE | Freq: Three times a day (TID) | ORAL | 1 refills | Status: DC

## 2018-11-10 ENCOUNTER — Ambulatory Visit: Payer: Medicare Other | Admitting: Psychiatry

## 2018-11-12 ENCOUNTER — Telehealth: Payer: Self-pay

## 2018-11-12 NOTE — Telephone Encounter (Signed)
Prior authorization submitted for Gapapentin 400mg  2 capsules tid #540, approved through [facility] effective 10/13/2018-11/12/2019  BT#[id number]

## 2018-11-18 ENCOUNTER — Other Ambulatory Visit: Payer: Self-pay | Admitting: Psychiatry

## 2018-11-19 ENCOUNTER — Other Ambulatory Visit: Payer: Self-pay

## 2018-11-19 MED ORDER — ZOLPIDEM TARTRATE 10 MG PO TABS: ORAL_TABLET | ORAL | 1 refills | Status: DC

## 2018-11-25 ENCOUNTER — Other Ambulatory Visit: Payer: Self-pay

## 2018-11-25 MED ORDER — LAMOTRIGINE 200 MG PO TABS: 200.0000 mg | ORAL_TABLET | Freq: Every day | ORAL | 0 refills | Status: DC

## 2018-12-18 ENCOUNTER — Other Ambulatory Visit: Payer: Self-pay | Admitting: Psychiatry

## 2018-12-18 NOTE — Telephone Encounter (Signed)
Please refill.

## 2018-12-21 ENCOUNTER — Other Ambulatory Visit: Payer: Self-pay | Admitting: Psychiatry

## 2019-01-05 ENCOUNTER — Telehealth: Payer: Self-pay | Admitting: Psychiatry

## 2019-01-05 NOTE — Telephone Encounter (Signed)
Patient called and said that he needs a 90 day supply of concerta 54 mg sent to [facility] [address] In [place]

## 2019-01-06 ENCOUNTER — Other Ambulatory Visit: Payer: Self-pay

## 2019-01-06 DIAGNOSIS — F900 Attention-deficit hyperactivity disorder, predominantly inattentive type: Secondary | ICD-10-CM

## 2019-01-06 MED ORDER — METHYLPHENIDATE HCL ER (OSM) 54 MG PO TBCR: 54.0000 mg | EXTENDED_RELEASE_TABLET | ORAL | 0 refills | Status: DC

## 2019-01-06 NOTE — Telephone Encounter (Signed)
Pended for approval.

## 2019-02-17 ENCOUNTER — Other Ambulatory Visit: Payer: Self-pay | Admitting: Psychiatry

## 2019-02-18 ENCOUNTER — Telehealth: Payer: Self-pay | Admitting: Psychiatry

## 2019-02-18 ENCOUNTER — Other Ambulatory Visit: Payer: Self-pay

## 2019-02-18 NOTE — Telephone Encounter (Signed)
Pt called to advise all meds should go to [facility] for refills except for Concerta 54mg  [facility] [address].

## 2019-02-18 NOTE — Telephone Encounter (Signed)
Appreciate the update, we received faxes for all medications from both pharmacies. Will update refills.

## 2019-02-19 MED ORDER — LAMOTRIGINE 200 MG PO TABS: 200.0000 mg | ORAL_TABLET | Freq: Every day | ORAL | 1 refills | Status: DC

## 2019-02-19 MED ORDER — ZOLPIDEM TARTRATE 10 MG PO TABS: ORAL_TABLET | ORAL | 0 refills | Status: DC

## 2019-02-23 ENCOUNTER — Ambulatory Visit (INDEPENDENT_AMBULATORY_CARE_PROVIDER_SITE_OTHER): Payer: Medicare Other | Admitting: Psychiatry

## 2019-02-23 ENCOUNTER — Other Ambulatory Visit: Payer: Self-pay

## 2019-02-23 DIAGNOSIS — F4010 Social phobia, unspecified: Secondary | ICD-10-CM | POA: Diagnosis not present

## 2019-02-23 DIAGNOSIS — Z636 Dependent relative needing care at home: Secondary | ICD-10-CM

## 2019-02-23 DIAGNOSIS — F319 Bipolar disorder, unspecified: Secondary | ICD-10-CM | POA: Diagnosis not present

## 2019-02-23 DIAGNOSIS — R6889 Other general symptoms and signs: Secondary | ICD-10-CM

## 2019-02-23 DIAGNOSIS — Z8659 Personal history of other mental and behavioral disorders: Secondary | ICD-10-CM | POA: Diagnosis not present

## 2019-02-23 DIAGNOSIS — Z20822 Contact with and (suspected) exposure to covid-19: Secondary | ICD-10-CM
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
## 2019-03-02 ENCOUNTER — Ambulatory Visit: Payer: Medicare Other | Admitting: Psychiatry

## 2019-03-16 ENCOUNTER — Ambulatory Visit (INDEPENDENT_AMBULATORY_CARE_PROVIDER_SITE_OTHER): Payer: Medicare Other | Admitting: Psychiatry

## 2019-03-16 ENCOUNTER — Other Ambulatory Visit: Payer: Self-pay

## 2019-03-16 DIAGNOSIS — Z636 Dependent relative needing care at home: Secondary | ICD-10-CM

## 2019-03-16 DIAGNOSIS — F341 Dysthymic disorder: Secondary | ICD-10-CM

## 2019-03-16 DIAGNOSIS — F3130 Bipolar disorder, current episode depressed, mild or moderate severity, unspecified: Secondary | ICD-10-CM | POA: Diagnosis not present

## 2019-03-16 DIAGNOSIS — Z8659 Personal history of other mental and behavioral disorders: Secondary | ICD-10-CM

## 2019-03-16 DIAGNOSIS — F4010 Social phobia, unspecified: Secondary | ICD-10-CM

## 2019-03-16 DIAGNOSIS — F900 Attention-deficit hyperactivity disorder, predominantly inattentive type: Secondary | ICD-10-CM
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
## 2019-04-05 ENCOUNTER — Other Ambulatory Visit: Payer: Self-pay | Admitting: Psychiatry

## 2019-04-06 ENCOUNTER — Other Ambulatory Visit: Payer: Self-pay

## 2019-04-06 ENCOUNTER — Telehealth: Payer: Self-pay | Admitting: Psychiatry

## 2019-04-06 ENCOUNTER — Ambulatory Visit: Payer: Medicare Other | Admitting: Psychiatry

## 2019-04-06 DIAGNOSIS — F900 Attention-deficit hyperactivity disorder, predominantly inattentive type: Secondary | ICD-10-CM

## 2019-04-06 MED ORDER — METHYLPHENIDATE HCL ER (OSM) 54 MG PO TBCR: 54.0000 mg | EXTENDED_RELEASE_TABLET | ORAL | 0 refills | Status: DC

## 2019-04-06 NOTE — Telephone Encounter (Signed)
Pt called [facility]ill needed for [facility]a 54 mg [facility]   [place]  ([place]

## 2019-04-06 NOTE — Telephone Encounter (Signed)
Pended for approval.

## 2019-04-13 ENCOUNTER — Ambulatory Visit (INDEPENDENT_AMBULATORY_CARE_PROVIDER_SITE_OTHER): Payer: Medicare Other | Admitting: Psychiatry

## 2019-04-13 ENCOUNTER — Other Ambulatory Visit: Payer: Self-pay

## 2019-04-13 DIAGNOSIS — F3130 Bipolar disorder, current episode depressed, mild or moderate severity, unspecified: Secondary | ICD-10-CM | POA: Diagnosis not present

## 2019-04-13 DIAGNOSIS — Z8659 Personal history of other mental and behavioral disorders: Secondary | ICD-10-CM

## 2019-04-13 DIAGNOSIS — F4010 Social phobia, unspecified: Secondary | ICD-10-CM

## 2019-04-13 DIAGNOSIS — Z636 Dependent relative needing care at home: Secondary | ICD-10-CM

## 2019-04-13 DIAGNOSIS — F341 Dysthymic disorder: Secondary | ICD-10-CM
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
## 2019-04-14 ENCOUNTER — Other Ambulatory Visit: Payer: Self-pay

## 2019-04-27 ENCOUNTER — Other Ambulatory Visit: Payer: Self-pay

## 2019-04-27 MED ORDER — GABAPENTIN 400 MG PO CAPS: 800.0000 mg | ORAL_CAPSULE | Freq: Three times a day (TID) | ORAL | 1 refills | Status: DC

## 2019-05-18 ENCOUNTER — Ambulatory Visit (INDEPENDENT_AMBULATORY_CARE_PROVIDER_SITE_OTHER): Payer: Medicare Other | Admitting: Psychiatry

## 2019-05-18 ENCOUNTER — Other Ambulatory Visit: Payer: Self-pay

## 2019-05-18 DIAGNOSIS — Z636 Dependent relative needing care at home: Secondary | ICD-10-CM

## 2019-05-18 DIAGNOSIS — F341 Dysthymic disorder: Secondary | ICD-10-CM | POA: Diagnosis not present

## 2019-05-18 DIAGNOSIS — F3130 Bipolar disorder, current episode depressed, mild or moderate severity, unspecified: Secondary | ICD-10-CM

## 2019-05-18 DIAGNOSIS — Z8659 Personal history of other mental and behavioral disorders: Secondary | ICD-10-CM | POA: Diagnosis not present
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
## 2019-05-19 ENCOUNTER — Other Ambulatory Visit: Payer: Self-pay

## 2019-05-19 ENCOUNTER — Ambulatory Visit (INDEPENDENT_AMBULATORY_CARE_PROVIDER_SITE_OTHER): Payer: BC Managed Care – PPO | Admitting: Psychiatry

## 2019-05-19 ENCOUNTER — Encounter: Payer: Self-pay | Admitting: Psychiatry

## 2019-05-19 DIAGNOSIS — R7989 Other specified abnormal findings of blood chemistry: Secondary | ICD-10-CM

## 2019-05-19 DIAGNOSIS — F4010 Social phobia, unspecified: Secondary | ICD-10-CM

## 2019-05-19 DIAGNOSIS — F3130 Bipolar disorder, current episode depressed, mild or moderate severity, unspecified: Secondary | ICD-10-CM

## 2019-05-19 DIAGNOSIS — F4310 Post-traumatic stress disorder, unspecified: Secondary | ICD-10-CM

## 2019-05-19 DIAGNOSIS — F900 Attention-deficit hyperactivity disorder, predominantly inattentive type: Secondary | ICD-10-CM

## 2019-05-19 DIAGNOSIS — Z8659 Personal history of other mental and behavioral disorders: Secondary | ICD-10-CM

## 2019-05-19 NOTE — Progress Notes (Signed)
Mark Hull [id number] March 06, 1956 63 y.o.  Virtual Visit via Telephone Note  I connected with pt by telephone and verified that I am speaking with the correct person using two identifiers.   I discussed the limitations, risks, security and privacy concerns of performing an evaluation and management service by telephone and the availability of in person appointments. I also discussed with the patient that there may be a patient responsible charge related to this service. The patient expressed understanding and agreed to proceed.  I discussed the assessment and treatment plan with the patient. The patient was provided an opportunity to ask questions and all were answered. The patient agreed with the plan and demonstrated an understanding of the instructions.   The patient was advised to call back or seek an in-person evaluation if the symptoms worsen or if the condition fails to improve as anticipated.  I provided 25 minutes of non-face-to-face time during this encounter. The call started at 1045 and ended at 1110. The patient was located at home and the provider was located office.   Subjective:   Patient ID:  Mark Hull is a 63 y.o. (DOB 1956/05/29) male.  Chief Complaint:  Chief Complaint  Patient presents with  . Follow-up    Medication Management  . Anxiety    Medication Management  . Depression    Medication Management  . Other    PTSD    Anxiety Symptoms include nervous/anxious behavior. Patient reports no confusion, decreased concentration or suicidal ideas.    Depression        Associated symptoms include no decreased concentration and no suicidal ideas.  Past medical history includes anxiety.    Mark Hull presents to the office today for follow-up of mood anxiety.  Last seen March 2020.  No meds were changed.  Stress wife, Olegario Messier,  [place] PD from 2 doctors and she won't accept the dx or treatment and he feels stuck and anxious over it.  He's trying to move to one  level house.    Doing very well overall.  Things improved with the Concerta added June 2019.  Level of depression is improved not gone.  Depression improved from 4/10 to 6/10.  Anxiety overall worse DT stress with wife.  Particularly worse in the afternoon when he has more time to think about things.  Sleep is good.  Concerta helps energy.  No significant manic symptoms since here.  No irritability or agitation.  Minimal racing thoughts except when anxious.  Satisfied with the meds.  Past Psychiatric Medication Trials: Viibryd, Wellbutrin, venlafaxine, fluoxetine, paroxetine, sertraline Lithium, gabapentin, quetiapine, Latuda, Abilify, Vraylar side effects, lamotrigine Concerta, pramipexole, Nuvigil Clonazepam, Ambien  Review of Systems:  Review of Systems  Genitourinary: Negative for difficulty urinating.  Musculoskeletal: Positive for arthralgias.  Neurological: Negative for tremors and weakness.  Psychiatric/Behavioral: Positive for depression and dysphoric mood. Negative for agitation, behavioral problems, confusion, decreased concentration, hallucinations, self-injury, sleep disturbance and suicidal ideas. The patient is nervous/anxious. The patient is not hyperactive.     Medications: I have reviewed the patient's current medications.  Current Outpatient Medications  Medication Sig Dispense Refill  . atorvastatin (LIPITOR) 10 MG tablet Take 1 tablet by mouth daily at 6 PM.     . clonazePAM (KLONOPIN) 1 MG tablet TAKE 1/2 TABLET TWICE A DAY AND TAKE 1 TABLT AT 4PM AND TAKE 1/2 TAB AS NEEDED 225 tablet 1  . finasteride (PROSCAR) 5 MG tablet     . FLUoxetine (PROZAC) 40 MG  capsule TAKE 2 CAPSULES DAILY 180 capsule 1  . gabapentin (NEURONTIN) 400 MG capsule Take 2 capsules (800 mg total) by mouth 3 (three) times daily. 540 capsule 1  . lamoTRIgine (LAMICTAL) 200 MG tablet Take 1 tablet (200 mg total) by mouth at bedtime. 90 tablet 1  . methylphenidate 54 MG PO CR tablet Take 1 tablet (54  mg total) by mouth every morning. 90 tablet 0  . Omega-3 Fatty Acids (FISH OIL PO) Take 1 capsule by mouth daily.    . QUEtiapine (SEROQUEL) 400 MG tablet TAKE 2 TABLETS AT BEDTIME 180 tablet 1  . testosterone (ANDROGEL) 50 MG/5GM (1%) GEL USE 1 TUBE DAILY AS PRESCRIBED 450 g 3  . zolpidem (AMBIEN) 10 MG tablet Take 1/2 to 1 tablet of ( 10 mg) by mouth at Bedtime as needed 90 tablet 0   No current facility-administered medications for this visit.     Medication Side Effects: None  Allergies:  Allergies  Allergen Reactions  . Wellbutrin [Bupropion] Photosensitivity and Anxiety    Past Medical History:  Diagnosis Date  . Anxiety   . Arthritis   . Bipolar disorder (HCC)   . Depression   . Gait disorder 12/17/2013  . GERD (gastroesophageal reflux disease)   . Primary localized osteoarthritis of right knee 11/06/2016  . PTSD (post-traumatic stress disorder)     Family History  Problem Relation Age of Onset  . Heart failure Father   . Cancer Father   . COPD Mother   . Cancer Sister     Social History   Socioeconomic History  . Marital status: Married    Spouse name: Not on file  . Number of children: Not on file  . Years of education: Not on file  . Highest education level: Not on file  Occupational History  . Not on file  Social Needs  . Financial resource strain: Not on file  . Food insecurity    Worry: Not on file    Inability: Not on file  . Transportation needs    Medical: Not on file    Non-medical: Not on file  Tobacco Use  . Smoking status: Never Smoker  . Smokeless tobacco: Never Used  Substance and Sexual Activity  . Alcohol use: No  . Drug use: No  . Sexual activity: Not on file  Lifestyle  . Physical activity    Days per week: Not on file    Minutes per session: Not on file  . Stress: Not on file  Relationships  . Social Musicianconnections    Talks on phone: Not on file    Gets together: Not on file    Attends religious service: Not on file     Active member of club or organization: Not on file    Attends meetings of clubs or organizations: Not on file    Relationship status: Not on file  . Intimate partner violence    Fear of current or ex partner: Not on file    Emotionally abused: Not on file    Physically abused: Not on file    Forced sexual activity: Not on file  Other Topics Concern  . Not on file  Social History Narrative  . Not on file    Past Medical History, Surgical history, Social history, and Family history were reviewed and updated as appropriate.   Please see review of systems for further details on the patient's review from today.   Objective:   Physical Exam:  There  were no vitals taken for this visit.  Physical Exam Neurological:     Mental Status: He is alert and oriented to person, place, and time.     Cranial Nerves: No dysarthria.  Psychiatric:        Attention and Perception: Attention normal.        Mood and Affect: Mood is anxious and depressed.        Speech: Speech normal.        Behavior: Behavior is cooperative.        Thought Content: Thought content normal. Thought content is not paranoid or delusional. Thought content does not include homicidal or suicidal ideation. Thought content does not include homicidal or suicidal plan.        Cognition and Memory: Cognition and memory normal.        Judgment: Judgment normal.     Comments: Insight fair to good. More stressed dealing with wife over PD dx.     Lab Review:     Component Value Date/Time   NA 134 (L) 11/07/2016 0533   K 3.9 11/07/2016 0533   CL 99 (L) 11/07/2016 0533   CO2 26 11/07/2016 0533   GLUCOSE 125 (H) 11/07/2016 0533   BUN 6 11/07/2016 0533   CREATININE 0.99 11/07/2016 0533   CALCIUM 8.4 (L) 11/07/2016 0533   GFRNONAA >60 11/07/2016 0533   GFRAA >60 11/07/2016 0533       Component Value Date/Time   WBC 6.8 11/07/2016 0533   RBC 3.79 (L) 11/07/2016 0533   HGB 11.5 (L) 11/07/2016 0533   HCT 35.0 (L)  11/07/2016 0533   PLT 196 11/07/2016 0533   MCV 92.3 11/07/2016 0533   MCH 30.3 11/07/2016 0533   MCHC 32.9 11/07/2016 0533   RDW 12.9 11/07/2016 0533    No results found for: POCLITH, LITHIUM   No results found for: PHENYTOIN, PHENOBARB, VALPROATE, CBMZ   .res Assessment: Plan:    Bipolar I disorder, most recent episode depressed (HCC)  History of posttraumatic stress disorder (PTSD)  Social anxiety disorder  Attention deficit hyperactivity disorder (ADHD), predominantly inattentive type  PTSD (post-traumatic stress disorder)  Low testosterone  Greater than 50% of face to face time with patient was spent on counseling and coordination of care. We discussed Patient has multiple psychiatric diagnoses as noted.  The last medicine change was the addition of Concerta in June 2019 which further reduced his depressive symptoms and improved his energy and productivity.  It is not ideal to be using that in combination with the benzodiazepines and he is aware of that and trying to minimize the amount of benzodiazepine.  He is tolerating his medications well.  He does not want medicine changes.  No medicine changes today.  He feels fine with the meds but anxious in the afternoon more. Continue the following meds: Concerta 54 mg every morning Quetiapine 400 mg nightly Clonazepam 1/2 tablet twice a day and 1 tablet at 4 PM and 1/2 tablet as needed anxiety Fluoxetine 80 mg daily Lamotrigine 200 mg daily Gabapentin 800 mg 3 times daily Ambien 5 mg nightly as needed insomnia  Discussed the polypharmacy again which is not ideal but has been helpful and he is tolerating it.  Discussed the risks associated.  We discussed the short-term risks associated with benzodiazepines including sedation and increased fall risk among others.  Discussed long-term side effect risk including dependence, potential withdrawal symptoms, and the potential eventual dose-related risk of dementia.  As noted above  the  patient gets benefit from gabapentin.  Discussed insurance problems with it and we will apparently have to do a prior authorization to continue it.  We both feel that it is helpful and the benefit outweighs any risks.  Additionally I am quite confident in his anxiety at least as well as his joint pain would worsen markedly off of the medication.  Supportive therapy dealing with wife's new dx PD and her refusal to accept the dx or any treatment.  Disc strategies to help get her engaged in treatment by reframing it as a tremor rather than as PD.  Disc this in detail.    This is a 30-minute appointment  Follow-up 4 months  Lynder Parents MD, DFAPA  Please see After Visit Summary for patient specific instructions.  No future appointments.  No orders of the defined types were placed in this encounter.     -------------------------------

## 2019-06-15 ENCOUNTER — Ambulatory Visit (INDEPENDENT_AMBULATORY_CARE_PROVIDER_SITE_OTHER): Payer: Medicare Other | Admitting: Psychiatry

## 2019-06-15 ENCOUNTER — Other Ambulatory Visit: Payer: Self-pay

## 2019-06-15 DIAGNOSIS — F3130 Bipolar disorder, current episode depressed, mild or moderate severity, unspecified: Secondary | ICD-10-CM | POA: Diagnosis not present

## 2019-06-15 DIAGNOSIS — F4010 Social phobia, unspecified: Secondary | ICD-10-CM

## 2019-06-15 DIAGNOSIS — Z8659 Personal history of other mental and behavioral disorders: Secondary | ICD-10-CM

## 2019-06-15 DIAGNOSIS — F341 Dysthymic disorder: Secondary | ICD-10-CM
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
## 2019-06-16 ENCOUNTER — Other Ambulatory Visit: Payer: Self-pay

## 2019-06-16 MED ORDER — ZOLPIDEM TARTRATE 10 MG PO TABS: ORAL_TABLET | ORAL | 0 refills | Status: DC

## 2019-06-16 MED ORDER — CLONAZEPAM 1 MG PO TABS: ORAL_TABLET | ORAL | 1 refills | Status: DC

## 2019-07-05 ENCOUNTER — Other Ambulatory Visit: Payer: Self-pay | Admitting: Psychiatry

## 2019-07-05 NOTE — Telephone Encounter (Signed)
Due back in Jan/Feb

## 2019-07-08 ENCOUNTER — Telehealth: Payer: Self-pay | Admitting: Psychiatry

## 2019-07-08 ENCOUNTER — Other Ambulatory Visit: Payer: Self-pay | Admitting: Psychiatry

## 2019-07-08 DIAGNOSIS — F900 Attention-deficit hyperactivity disorder, predominantly inattentive type: Secondary | ICD-10-CM

## 2019-07-08 MED ORDER — METHYLPHENIDATE HCL ER (OSM) 54 MG PO TBCR: 54.0000 mg | EXTENDED_RELEASE_TABLET | ORAL | 0 refills | Status: DC

## 2019-07-08 NOTE — Telephone Encounter (Signed)
Last refill 04/06/2019 for 90 day refill  Pended for approval Last apt 05/19/2019

## 2019-07-08 NOTE — Telephone Encounter (Signed)
[facility]  [facility]  [place], [place], [place]

## 2019-07-26 ENCOUNTER — Other Ambulatory Visit: Payer: Self-pay | Admitting: Psychiatry

## 2019-07-30 ENCOUNTER — Other Ambulatory Visit: Payer: Self-pay | Admitting: Psychiatry

## 2019-07-30 ENCOUNTER — Other Ambulatory Visit: Payer: Self-pay

## 2019-07-30 MED ORDER — TESTOSTERONE 50 MG/5GM (1%) TD GEL: TRANSDERMAL | 3 refills | Status: DC

## 2019-09-04 ENCOUNTER — Ambulatory Visit (INDEPENDENT_AMBULATORY_CARE_PROVIDER_SITE_OTHER): Payer: Medicare Other | Admitting: Psychiatry

## 2019-09-04 DIAGNOSIS — Z8659 Personal history of other mental and behavioral disorders: Secondary | ICD-10-CM

## 2019-09-04 DIAGNOSIS — F3130 Bipolar disorder, current episode depressed, mild or moderate severity, unspecified: Secondary | ICD-10-CM

## 2019-09-04 DIAGNOSIS — F341 Dysthymic disorder: Secondary | ICD-10-CM

## 2019-09-04 DIAGNOSIS — Z636 Dependent relative needing care at home: Secondary | ICD-10-CM

## 2019-09-04 DIAGNOSIS — F4010 Social phobia, unspecified: Secondary | ICD-10-CM

## 2019-09-04 NOTE — Progress Notes (Signed)
Psychotherapy Progress Note [facility] [name], PhD LP  Patient ID: Mark Hull     MRN: [id number]     Therapy format: Individual psychotherapy Date: 09/04/2019      Start: 4:20p     Stop: 5:07p     Time Spent: 47 min Location: Telehealth visit -- I connected with this patient by an approved telecommunication method (audio only), with his informed consent, and verifying identity and patient privacy.  I was located at my office and patient at his home.  As needed, we discussed the limitations, risks, and security and privacy concerns associated with telehealth service, including the availability and conditions which currently govern in-person appointments and the possibility that 3rd-party payment may not be fully guaranteed and he may be responsible for charges.  After he indicated understanding, we proceeded with the session.  Also discussed treatment planning, as needed, including ongoing verbal agreement with the plan, the opportunity to ask and answer all questions, his demonstrated understanding of instructions, and his readiness to call the office should symptoms worsen or he feels he is in a crisis state and needs more immediate and tangible assistance.   Session narrative (presenting needs, interim history, self-report of stressors and symptoms, applications of prior therapy, status changes, and interventions made in session) Out of touch 2 months, catching up.  Sold house (3600 sf), closing in February, looking for 1100 sf, wider halls and doorways, preparing for wife's impending disability.  Sees her tremor keep worsening, but she continues to be adamant that she is fine, healed, and will not hear anything more about Parkinson's, flies hostile if he tries.   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  Discussed financial problem-solving, as income slows and his own insurance coverage will change with turning 64 in  August.  Erroneously assumes he can't ask about it until August, but assured he can find out from [place] how it works before having to commit.  Crucial question is whether he can carry spouse when that happens or they have to find her an individual policy.  Most likely she needs to declare disability, since the Parkinson's she adamantly denies is turning into dementia now, including auditory hallucinations and difficulty with working memory and tracking conversations.  Discussed financial options and sources of help, debunked overly helpless understandings, affirmed actions taken to control financial risk and prepare.  Recommended Alzheimer's Association for source of further support and information re. wife's growing dementia.  Briefed on potential legal needs, including HIPAA authorizations, POA (not drawn up), involuntary commitment process and criteria, and  Guardianship if needed.  Has gotten a letter from [facility] discharging wife from the practice for lack of followup.  Discussed and advised on approach to the practice, recommended he confer with office manager about special situation and asking to be allowed to still schedule, then talk with nurse re.   PT experiencing a bit of word-finding trouble himself, but fluent, alert and responsive in conversation.  Seems to be stress-related, validated the range of stresses he is under presently that would cause almost anyone with his experiences and status to have a few "brownouts".    Therapeutic modalities: Cognitive Behavioral Therapy, Solution-Oriented/Positive Psychology and Psycho-education/Bibliotherapy  Mental Status/Observations:  Appearance:   Not assessed     Behavior:  Appropriate  Motor:  Not assessed  Speech/Language:   Clear and Coherent  Affect:  Not assessed  Mood:  anxious  and depressed  Thought process:  normal  Thought content:    WNL and multiple worries, some hopelessness  Sensory/Perceptual disturbances:     WNL  Orientation:  Fully oriented  Attention:  Good  Concentration:  Good  Memory:  WNL  Insight:    Fair  Judgment:   Good  Impulse Control:  Good   Risk Assessment: Danger to Self: No Self-injurious Behavior: No Danger to Others: No Physical Aggression / Violence: No Duty to Warn: No Access to Firearms a concern: No  Assessment of progress:  situational setback(s)  Diagnosis:   ICD-10-CM   1. Severe early onset dysthymic disorder, in partial remission, with anxious distress, with intermittent major depressive episodes, with current episode  F34.1   2. Bipolar I disorder, most recent episode depressed (HCC)  F31.30   3. Social anxiety disorder  F40.10   4. Caregiver stress  Z63.6   5. History of posttraumatic stress disorder (PTSD)  Z86.59    Plan:  . Specific advice provided to patient via email (no MyChart account) . Other recommendations/advice as noted above . Continue to utilize previously learned skills ad lib . Maintain medication as prescribed and work faithfully with relevant prescriber(s) if any changes are desired or seem indicated . Call the clinic on-call service, present to ER, or call 911 if any life-threatening psychiatric crisis Return in about 3 weeks (around 09/25/2019) for will call. Current [facility] appointments: No future appointments.  Robley Fries, PhD [name], PhD LP Clinical Psychologist, [facility] [address] [place], [place] [address] [phone]

## 2019-09-30 ENCOUNTER — Other Ambulatory Visit: Payer: Self-pay | Admitting: Psychiatry

## 2019-10-09 ENCOUNTER — Telehealth: Payer: Self-pay | Admitting: Psychiatry

## 2019-10-09 ENCOUNTER — Other Ambulatory Visit: Payer: Self-pay

## 2019-10-09 DIAGNOSIS — F900 Attention-deficit hyperactivity disorder, predominantly inattentive type: Secondary | ICD-10-CM

## 2019-10-09 MED ORDER — METHYLPHENIDATE HCL ER (OSM) 54 MG PO TBCR: 54.0000 mg | EXTENDED_RELEASE_TABLET | ORAL | 0 refills | Status: DC

## 2019-10-09 NOTE — Telephone Encounter (Signed)
Last refill 07/08/2019 for 90 day Pended for Dr. Jennelle Human to submit Patient due back for follow up now

## 2019-10-09 NOTE — Telephone Encounter (Signed)
Pt [facility] [place] [place], on [facility]

## 2019-11-02 ENCOUNTER — Ambulatory Visit (INDEPENDENT_AMBULATORY_CARE_PROVIDER_SITE_OTHER): Payer: Medicare Other | Admitting: Psychiatry

## 2019-11-02 ENCOUNTER — Other Ambulatory Visit: Payer: Self-pay

## 2019-11-02 DIAGNOSIS — F341 Dysthymic disorder: Secondary | ICD-10-CM

## 2019-11-02 DIAGNOSIS — Z636 Dependent relative needing care at home: Secondary | ICD-10-CM | POA: Diagnosis not present

## 2019-11-02 DIAGNOSIS — F3130 Bipolar disorder, current episode depressed, mild or moderate severity, unspecified: Secondary | ICD-10-CM

## 2019-11-02 DIAGNOSIS — Z8659 Personal history of other mental and behavioral disorders: Secondary | ICD-10-CM

## 2019-11-02 NOTE — Progress Notes (Signed)
Psychotherapy Progress Note [facility] Mark Czar, PhD LP  Patient ID: Mark Hull     MRN: [id number] Therapy format: Individual psychotherapy Date: 11/02/2019      Start: 2:18p     Stop: 2:57p     Time Spent: 39 min Location: In-person   Session narrative (presenting needs, interim history, self-report of stressors and symptoms, applications of prior therapy, status changes, and interventions made in session) Blackened his eye running into a wall carrying furniture in recent move, in respect of foreseeable health problems and insurance increases.  Has eliminated $1500 mortgage payments now by downsizing the home and moving.  Turning 64 will mean Mark Hull's insurance goes up sharply, as a 60yo spouse on [employer] retiree plan.  Discussed possible avenues, but concurred that he will face increased health insurance costs for some time and it is wise for him to cut costs.  Mark Hull's Parkinson's has progressed enough that she is stiff, hand tremoring more.  Her anger has come down as reality sets in.  Some credit to himself for persisting without insisting.  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  Encouraged in continuing to approach her with choice, reality, and validation of her symptoms and doing the right thing to care for them.  Offered communication tactics for recruiting her growing willingness to treat, including evidence that her quality of life could be substantially better.  Generally, feeling more morose and tired of living.  Wants to be delivered to heaven, tired of living.  Not frankly suicidal, but missing his deceased daughter Mark Hull more of late, having gone through a new round of changes with living situation and wife's health, with implication that he may outlive Mark Hull in a way he never  expected.  Fellow member of the church suicided, pastor regarded as Chief Strategy Officer" to hell, which PT finds challenging, theologically.  Discussed at some length theology of suicide, PT clear he is not trying to dig in.    Therapeutic modalities: Cognitive Behavioral Therapy and Solution-Oriented/Positive Psychology  Mental Status/Observations:  Appearance:   Casual     Behavior:  Appropriate  Motor:  Normal  Speech/Language:   Clear and Coherent and quiet  Affect:  Constricted  Mood:  depressed  Thought process:  normal  Thought content:    Rumination  Sensory/Perceptual disturbances:    WNL  Orientation:  Fully oriented  Attention:  Good  Concentration:  Good  Memory:  WNL  Insight:    Good  Judgment:   Good  Impulse Control:  Good   Risk Assessment: Danger to Self: No Self-injurious Behavior: No Danger to Others: No Physical Aggression / Violence: No Duty to Warn: No Access to Firearms a concern: No  Assessment of progress:  stabilized  Diagnosis:   ICD-10-CM   1. Severe early onset dysthymic disorder, in partial remission, with anxious distress, with intermittent major depressive episodes, with current episode  F34.1   2. Bipolar I disorder, most recent episode depressed (HCC)  F31.30   3. History of posttraumatic stress disorder (PTSD)  Z86.59   4. Caregiver stress  Z63.6    Plan:  . Continue to support wife's positive choices to admit and treat her Parkinson's . Continue to assess finances as needed . Emphasize living for, and on behalf of, daughter and wife when at a  loss for purpose . Not on schedule for Dr. Clovis Hull -- was to have followed up by now, should RS . Other recommendations/advice as may be noted above . Continue to utilize previously learned skills ad lib . Maintain medication as prescribed and work faithfully with relevant prescriber(s) if any changes are desired or seem indicated . Call the clinic on-call service, present to ER, or call 911 if any  life-threatening psychiatric crisis . Return in about 3 weeks (around 11/23/2019).Marland Kitchen  Next scheduled visit in this office Visit date not found.  Mark Serve, PhD Mark Moore, PhD LP Clinical Psychologist, [facility] [address], [place], [place] [address] [phone]

## 2019-11-09 ENCOUNTER — Other Ambulatory Visit: Payer: Self-pay | Admitting: Psychiatry

## 2019-11-10 NOTE — Telephone Encounter (Signed)
Last apt 05/19/2019 was due back 4 months

## 2019-12-07 ENCOUNTER — Ambulatory Visit: Payer: Medicare Other | Admitting: Psychiatry

## 2019-12-22 ENCOUNTER — Other Ambulatory Visit: Payer: Self-pay

## 2019-12-23 MED ORDER — CLONAZEPAM 1 MG PO TABS: ORAL_TABLET | ORAL | 0 refills | Status: DC

## 2019-12-27 ENCOUNTER — Other Ambulatory Visit: Payer: Self-pay | Admitting: Psychiatry

## 2019-12-27 NOTE — Telephone Encounter (Signed)
Last apt 04/2019 was due back 4 months

## 2020-01-05 ENCOUNTER — Other Ambulatory Visit: Payer: Self-pay

## 2020-01-05 ENCOUNTER — Telehealth: Payer: Self-pay | Admitting: Psychiatry

## 2020-01-05 DIAGNOSIS — F900 Attention-deficit hyperactivity disorder, predominantly inattentive type: Secondary | ICD-10-CM

## 2020-01-05 MED ORDER — METHYLPHENIDATE HCL ER (OSM) 54 MG PO TBCR: 54.0000 mg | EXTENDED_RELEASE_TABLET | ORAL | 0 refills | Status: DC

## 2020-01-05 NOTE — Telephone Encounter (Signed)
Patient called and said that he needs a refill on his concerta 54 mg 90 day supply [facility] [place] [facility] [address]

## 2020-01-05 NOTE — Telephone Encounter (Signed)
Last refill 10/09/2019 for 90 day supply Last apt was 04/2019 Pended for Dr. Jennelle Human to review and send if appropriate. Patient does need to schedule apt

## 2020-01-07 ENCOUNTER — Other Ambulatory Visit: Payer: Self-pay | Admitting: Psychiatry

## 2020-01-07 ENCOUNTER — Other Ambulatory Visit: Payer: Self-pay

## 2020-01-07 MED ORDER — TESTOSTERONE 50 MG/5GM (1%) TD GEL: TRANSDERMAL | 0 refills | Status: DC

## 2020-01-18 ENCOUNTER — Other Ambulatory Visit: Payer: Self-pay

## 2020-01-18 MED ORDER — LAMOTRIGINE 200 MG PO TABS: 200.0000 mg | ORAL_TABLET | Freq: Every day | ORAL | 1 refills | Status: DC

## 2020-01-29 ENCOUNTER — Other Ambulatory Visit: Payer: Self-pay

## 2020-01-29 ENCOUNTER — Telehealth: Payer: Self-pay | Admitting: Psychiatry

## 2020-01-29 MED ORDER — GABAPENTIN 400 MG PO CAPS: ORAL_CAPSULE | ORAL | 0 refills | Status: DC

## 2020-01-29 NOTE — Telephone Encounter (Signed)
Requesting a refill on gabapentin (NEURONTIN) 400 MG capsule. Please send through [facility].

## 2020-02-02 ENCOUNTER — Other Ambulatory Visit: Payer: Self-pay

## 2020-02-02 ENCOUNTER — Telehealth: Payer: Self-pay

## 2020-02-02 MED ORDER — TESTOSTERONE 50 MG/5GM (1%) TD GEL: TRANSDERMAL | 0 refills | Status: DC

## 2020-02-02 NOTE — Telephone Encounter (Signed)
Received prior authorization request from [facility] for Gabapentin 400 mg 2 capsules tid, #540. Office notes and information faxed, High dosage of 2400 mg/day. Patient has been on this dose since before 2008. This is a yearly renewal for his PA. Pending response at this time.

## 2020-02-04 NOTE — Telephone Encounter (Signed)
Prior approval received back on GABAPENTIN 400 MG CAPSULES/ 2 capsules tid. 2400 mg daily. Effective 01/03/2020-02/01/2021 with [facility] TK#[id number], PA# [id number]

## 2020-03-07 ENCOUNTER — Ambulatory Visit (INDEPENDENT_AMBULATORY_CARE_PROVIDER_SITE_OTHER): Payer: Medicare Other | Admitting: Psychiatry

## 2020-03-07 ENCOUNTER — Other Ambulatory Visit: Payer: Self-pay

## 2020-03-07 DIAGNOSIS — F3130 Bipolar disorder, current episode depressed, mild or moderate severity, unspecified: Secondary | ICD-10-CM

## 2020-03-07 DIAGNOSIS — Z8659 Personal history of other mental and behavioral disorders: Secondary | ICD-10-CM

## 2020-03-07 DIAGNOSIS — Z636 Dependent relative needing care at home: Secondary | ICD-10-CM | POA: Diagnosis not present

## 2020-03-07 DIAGNOSIS — F4010 Social phobia, unspecified: Secondary | ICD-10-CM

## 2020-03-07 DIAGNOSIS — F341 Dysthymic disorder: Secondary | ICD-10-CM | POA: Diagnosis not present
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
## 2020-03-17 ENCOUNTER — Other Ambulatory Visit: Payer: Self-pay | Admitting: Psychiatry

## 2020-03-17 NOTE — Telephone Encounter (Signed)
Next apt 08/02

## 2020-03-28 ENCOUNTER — Ambulatory Visit (INDEPENDENT_AMBULATORY_CARE_PROVIDER_SITE_OTHER): Payer: Medicare Other | Admitting: Psychiatry

## 2020-03-28 ENCOUNTER — Encounter: Payer: Self-pay | Admitting: Psychiatry

## 2020-03-28 ENCOUNTER — Other Ambulatory Visit: Payer: Self-pay

## 2020-03-28 DIAGNOSIS — F341 Dysthymic disorder: Secondary | ICD-10-CM

## 2020-03-28 DIAGNOSIS — F4010 Social phobia, unspecified: Secondary | ICD-10-CM | POA: Diagnosis not present

## 2020-03-28 DIAGNOSIS — F3130 Bipolar disorder, current episode depressed, mild or moderate severity, unspecified: Secondary | ICD-10-CM | POA: Diagnosis not present

## 2020-03-28 DIAGNOSIS — F900 Attention-deficit hyperactivity disorder, predominantly inattentive type: Secondary | ICD-10-CM

## 2020-03-28 DIAGNOSIS — Z8659 Personal history of other mental and behavioral disorders: Secondary | ICD-10-CM

## 2020-03-28 DIAGNOSIS — R7989 Other specified abnormal findings of blood chemistry: Secondary | ICD-10-CM

## 2020-03-28 MED ORDER — QUETIAPINE FUMARATE 400 MG PO TABS: 800.0000 mg | ORAL_TABLET | Freq: Every day | ORAL | 3 refills | Status: DC

## 2020-03-28 MED ORDER — CLONAZEPAM 1 MG PO TABS: ORAL_TABLET | ORAL | 1 refills | Status: DC

## 2020-03-28 MED ORDER — GABAPENTIN 400 MG PO CAPS: ORAL_CAPSULE | ORAL | 1 refills | Status: DC

## 2020-03-28 MED ORDER — METHYLPHENIDATE HCL ER (OSM) 54 MG PO TBCR: 54.0000 mg | EXTENDED_RELEASE_TABLET | ORAL | 0 refills | Status: DC

## 2020-03-28 MED ORDER — FLUOXETINE HCL 40 MG PO CAPS: 80.0000 mg | ORAL_CAPSULE | Freq: Every day | ORAL | 3 refills | Status: DC

## 2020-03-28 MED ORDER — LAMOTRIGINE 200 MG PO TABS: 200.0000 mg | ORAL_TABLET | Freq: Every day | ORAL | 1 refills | Status: DC

## 2020-03-28 NOTE — Progress Notes (Signed)
Mark HancockDanny R Hull [id number] 11/09/1955 64 y.o.   Subjective:   Patient ID:  Mark Hull is a 64 y.o. (DOB 11/11/1955) male.  Chief Complaint:  Chief Complaint  Patient presents with  . Follow-up  . Depression  . Anxiety    Anxiety Symptoms include nervous/anxious behavior. Patient reports no confusion, decreased concentration or suicidal ideas.    Depression        Associated symptoms include myalgias.  Associated symptoms include no decreased concentration and no suicidal ideas.  Past medical history includes anxiety.    Mark HancockDanny R Daus presents to the office today for follow-up of mood anxiety.  Last seen November 2020.  No meds were changed.  Stress wife, Mark Hull,  [place] 2 doctors and she won't accept the dx or treatment and he feels stuck and anxious over it.  He's trying to move to one level house.   Also stress F died 02/13/20 at 65 yo.   Tolerating meds. No desire to change meds.  Doing very well overall.  Things improved with the Concerta added June 2019.  Level of depression is improved not gone.  Depression improved 4/10.  Anxiety manageable.  Sleep is good.  Concerta helps energy.  No significant manic symptoms since here.  No irritability or agitation.  Minimal racing thoughts except when anxious.  Satisfied with the meds.  Past Psychiatric Medication Trials: Viibryd, Wellbutrin, venlafaxine, fluoxetine, paroxetine, sertraline Lithium, gabapentin, quetiapine, Latuda, Abilify, Vraylar side effects, lamotrigine Concerta, pramipexole, Nuvigil Clonazepam, Ambien  Review of Systems:  Review of Systems  Genitourinary: Negative for difficulty urinating.  Musculoskeletal: Positive for arthralgias and myalgias.  Neurological: Negative for tremors and weakness.  Psychiatric/Behavioral: Positive for depression and dysphoric mood. Negative for agitation, behavioral problems, confusion, decreased concentration, hallucinations, self-injury, sleep disturbance and suicidal ideas.  The patient is nervous/anxious. The patient is not hyperactive.     Medications: I have reviewed the patient's current medications.  Current Outpatient Medications  Medication Sig Dispense Refill  . atorvastatin (LIPITOR) 10 MG tablet Take 1 tablet by mouth daily at 6 PM.     . clonazePAM (KLONOPIN) 1 MG tablet TAKE ONE-HALF (1/2) TABLET TWICE A DAY AND TAKE 1 TABLET AT 4 P.M. AND TAKE ONE-HALF (1/2) TABLET AS NEEDED 225 tablet 1  . finasteride (PROSCAR) 5 MG tablet     . FLUoxetine (PROZAC) 40 MG capsule Take 2 capsules (80 mg total) by mouth daily. 180 capsule 3  . gabapentin (NEURONTIN) 400 MG capsule TAKE 2 CAPSULES THREE TIMES A DAY 540 capsule 1  . lamoTRIgine (LAMICTAL) 200 MG tablet Take 1 tablet (200 mg total) by mouth at bedtime. 90 tablet 1  . methylphenidate 54 MG PO CR tablet Take 1 tablet (54 mg total) by mouth every morning. 90 tablet 0  . Omega-3 Fatty Acids (FISH OIL PO) Take 1 capsule by mouth daily.    . QUEtiapine (SEROQUEL) 400 MG tablet Take 2 tablets (800 mg total) by mouth at bedtime. 180 tablet 3  . testosterone (ANDROGEL) 50 MG/5GM (1%) GEL Use 1 tube as prescribed (Patient not taking: Reported on 03/28/2020) 450 g 0  . zolpidem (AMBIEN) 10 MG tablet Take 1/2 to 1 tablet of ( 10 mg) by mouth at Bedtime as needed (Patient not taking: Reported on 03/28/2020) 90 tablet 0   No current facility-administered medications for this visit.    Medication Side Effects: None  Allergies:  Allergies  Allergen Reactions  . Wellbutrin [Bupropion] Photosensitivity and Anxiety  Past Medical History:  Diagnosis Date  . Anxiety   . Arthritis   . Bipolar disorder (HCC)   . Depression   . Gait disorder 12/17/2013  . GERD (gastroesophageal reflux disease)   . Primary localized osteoarthritis of right knee 11/06/2016  . PTSD (post-traumatic stress disorder)     Family History  Problem Relation Age of Onset  . Heart failure Father   . Cancer Father   . COPD Mother   .  Cancer Sister     Social History   Socioeconomic History  . Marital status: Married    Spouse name: Not on file  . Number of children: Not on file  . Years of education: Not on file  . Highest education level: Not on file  Occupational History  . Not on file  Tobacco Use  . Smoking status: Never Smoker  . Smokeless tobacco: Never Used  Substance and Sexual Activity  . Alcohol use: No  . Drug use: No  . Sexual activity: Not on file  Other Topics Concern  . Not on file  Social History Narrative  . Not on file   Social Determinants of Health   Financial Resource Strain:   . Difficulty of Paying Living Expenses:   Food Insecurity:   . Worried About Programme researcher, broadcasting/film/video in the Last Year:   . Barista in the Last Year:   Transportation Needs:   . Freight forwarder (Medical):   Marland Kitchen Lack of Transportation (Non-Medical):   Physical Activity:   . Days of Exercise per Week:   . Minutes of Exercise per Session:   Stress:   . Feeling of Stress :   Social Connections:   . Frequency of Communication with Friends and Family:   . Frequency of Social Gatherings with Friends and Family:   . Attends Religious Services:   . Active Member of Clubs or Organizations:   . Attends Banker Meetings:   Marland Kitchen Marital Status:   Intimate Partner Violence:   . Fear of Current or Ex-Partner:   . Emotionally Abused:   Marland Kitchen Physically Abused:   . Sexually Abused:     Past Medical History, Surgical history, Social history, and Family history were reviewed and updated as appropriate.   Please see review of systems for further details on the patient's review from today.   Objective:   Physical Exam:  There were no vitals taken for this visit.  Physical Exam Constitutional:      General: He is not in acute distress. Musculoskeletal:        General: No deformity.  Neurological:     Mental Status: He is alert and oriented to person, place, and time.     Cranial Nerves: No  dysarthria.     Coordination: Coordination normal.  Psychiatric:        Attention and Perception: Attention and perception normal. He does not perceive auditory or visual hallucinations.        Mood and Affect: Mood is anxious and depressed. Affect is not labile, blunt, angry or inappropriate.        Speech: Speech normal.        Behavior: Behavior normal. Behavior is cooperative.        Thought Content: Thought content normal. Thought content is not paranoid or delusional. Thought content does not include homicidal or suicidal ideation. Thought content does not include homicidal or suicidal plan.        Cognition and Memory:  Cognition and memory normal.        Judgment: Judgment normal.     Comments: Insight fair to good. Overall satisfied with meds     Lab Review:     Component Value Date/Time   NA 134 (L) 11/07/2016 0533   K 3.9 11/07/2016 0533   CL 99 (L) 11/07/2016 0533   CO2 26 11/07/2016 0533   GLUCOSE 125 (H) 11/07/2016 0533   BUN 6 11/07/2016 0533   CREATININE 0.99 11/07/2016 0533   CALCIUM 8.4 (L) 11/07/2016 0533   GFRNONAA >60 11/07/2016 0533   GFRAA >60 11/07/2016 0533       Component Value Date/Time   WBC 6.8 11/07/2016 0533   RBC 3.79 (L) 11/07/2016 0533   HGB 11.5 (L) 11/07/2016 0533   HCT 35.0 (L) 11/07/2016 0533   PLT 196 11/07/2016 0533   MCV 92.3 11/07/2016 0533   MCH 30.3 11/07/2016 0533   MCHC 32.9 11/07/2016 0533   RDW 12.9 11/07/2016 0533    No results found for: POCLITH, LITHIUM   No results found for: PHENYTOIN, PHENOBARB, VALPROATE, CBMZ   .res Assessment: Plan:    Bipolar I disorder, most recent episode depressed (HCC) - Plan: FLUoxetine (PROZAC) 40 MG capsule, lamoTRIgine (LAMICTAL) 200 MG tablet, QUEtiapine (SEROQUEL) 400 MG tablet  Severe early onset dysthymic disorder, in partial remission, with anxious distress, with intermittent major depressive episodes, with current episode  History of posttraumatic stress disorder (PTSD) -  Plan: clonazePAM (KLONOPIN) 1 MG tablet, gabapentin (NEURONTIN) 400 MG capsule  Social anxiety disorder - Plan: clonazePAM (KLONOPIN) 1 MG tablet, FLUoxetine (PROZAC) 40 MG capsule, gabapentin (NEURONTIN) 400 MG capsule  Attention deficit hyperactivity disorder (ADHD), predominantly inattentive type - Plan: methylphenidate 54 MG PO CR tablet  Low testosterone  Greater than 50% of face to face time with patient was spent on counseling and coordination of care. We discussed Patient has multiple psychiatric diagnoses as noted.  The last medicine change was the addition of Concerta in June 2019 which further reduced his depressive symptoms and improved his energy and productivity.  It is not ideal to be using that in combination with the benzodiazepines and he is aware of that and trying to minimize the amount of benzodiazepine.  He is tolerating his medications well.  He does not want medicine changes.  No medicine changes today.  He feels fine with the meds but anxious in the afternoon more. Continue the following meds: Concerta 54 mg every morning Quetiapine 400 mg nightly Clonazepam 1/2 tablet twice a day and 1 tablet at 4 PM and 1/2 tablet as needed anxiety Fluoxetine 80 mg daily Lamotrigine 200 mg daily Gabapentin 800 mg 3 times daily Mostly stopped Ambien 5 mg nightly as needed insomnia  Discussed the polypharmacy again which is not ideal but has been helpful and he is tolerating it.  Discussed the risks associated.  We discussed the short-term risks associated with benzodiazepines including sedation and increased fall risk among others.  Discussed long-term side effect risk including dependence, potential withdrawal symptoms, and the potential eventual dose-related risk of dementia.  As noted above the patient gets benefit from gabapentin.  Discussed insurance problems with it and we will apparently have to do a prior authorization to continue it.  We both feel that it is helpful and the  benefit outweighs any risks.  Additionally I am quite confident in his anxiety at least as well as his joint pain would worsen markedly off of the medication.  Supportive therapy  dealing with wife's new dx PD and her refusal to accept the dx or any treatment.  Disc strategies to help get her engaged in treatment by reframing it as a tremor rather than as PD.  Disc this in detail.    This is a 30-minute appointment  Follow-up 6 months  Meredith Staggers MD, DFAPA  Please see After Visit Summary for patient specific instructions.  No future appointments.  No orders of the defined types were placed in this encounter.     -------------------------------

## 2020-03-29 ENCOUNTER — Other Ambulatory Visit: Payer: Self-pay | Admitting: Psychiatry

## 2020-03-29 ENCOUNTER — Telehealth: Payer: Self-pay | Admitting: Psychiatry

## 2020-03-29 MED ORDER — METHYLPHENIDATE HCL 20 MG PO TABS
20.0000 mg | ORAL_TABLET | Freq: Three times a day (TID) | ORAL | 0 refills | Status: DC
Start: 2020-03-29 — End: 2020-04-29

## 2020-03-29 NOTE — Telephone Encounter (Signed)
Not sure if this was discuss with him previously?

## 2020-03-29 NOTE — Telephone Encounter (Signed)
Pt left message that the rx for Concerta is to expensive with the GoodRx card. Pt wants to know if he can get a rx mailed to him for Adderall 20mg  tid. He said that it will cost him about $49 for a 3 month supply. Please call.

## 2020-03-29 NOTE — Telephone Encounter (Signed)
He did not bring this up at his appt.  We want to use Ritalin and not Adderall bc the active ingredient in Concerta is the same as Ritalin. Ritalin is even less expensive than Adderall.  Rx sent in for Ritalin 20 TID

## 2020-03-30 NOTE — Telephone Encounter (Signed)
LM to call back to discuss information.

## 2020-03-30 NOTE — Telephone Encounter (Signed)
Left message again but did leave detailed information this time. Recommend he call back to the office tomorrow after 9:30 am if having additional questions or concerns.

## 2020-03-30 NOTE — Telephone Encounter (Signed)
LM to call back.

## 2020-03-30 NOTE — Telephone Encounter (Signed)
Pt LM returned call. Ask Traci to call back. [phone]

## 2020-03-31 NOTE — Telephone Encounter (Signed)
Called patient and discussed the Ritalin Rx, he agreed. Gave him prices at Good Rx in case his insurance is too expensive.

## 2020-04-04 ENCOUNTER — Ambulatory Visit: Payer: Medicare Other | Admitting: Psychiatry

## 2020-04-29 ENCOUNTER — Other Ambulatory Visit: Payer: Self-pay

## 2020-04-29 ENCOUNTER — Telehealth: Payer: Self-pay

## 2020-04-29 MED ORDER — METHYLPHENIDATE HCL 20 MG PO TABS: 20.0000 mg | ORAL_TABLET | Freq: Three times a day (TID) | ORAL | 0 refills | Status: DC

## 2020-04-29 NOTE — Telephone Encounter (Signed)
Prior approval received for gabapentin 400 mg capsules #540/90 day effective 03/27/2020-08/26/2020.   Patient notified and he also asked for refills on Ritalin, stated he called a few days ago and left a voice mail.  Pended 3 Rx's of Ritalin for Dr. Jennelle Human to review. Last refill 03/28/20  Due back 09/2020

## 2020-04-29 NOTE — Telephone Encounter (Signed)
Prior authorization initiated for GABAPENTIN 400 MG CAPSULE, 2 CAPSULES TID. #540. 2400 MG daily. Patient has been taking high dose since 2008. 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 [employer] Medicare Part D.   pending response.

## 2020-05-30 ENCOUNTER — Telehealth: Payer: Self-pay | Admitting: Psychiatry

## 2020-05-30 ENCOUNTER — Other Ambulatory Visit: Payer: Self-pay

## 2020-05-30 ENCOUNTER — Ambulatory Visit (INDEPENDENT_AMBULATORY_CARE_PROVIDER_SITE_OTHER): Payer: Medicare Other | Admitting: Psychiatry

## 2020-05-30 DIAGNOSIS — Z8659 Personal history of other mental and behavioral disorders: Secondary | ICD-10-CM

## 2020-05-30 DIAGNOSIS — Z636 Dependent relative needing care at home: Secondary | ICD-10-CM

## 2020-05-30 DIAGNOSIS — F3130 Bipolar disorder, current episode depressed, mild or moderate severity, unspecified: Secondary | ICD-10-CM

## 2020-05-30 DIAGNOSIS — F4010 Social phobia, unspecified: Secondary | ICD-10-CM | POA: Diagnosis not present

## 2020-05-30 DIAGNOSIS — F341 Dysthymic disorder: Secondary | ICD-10-CM | POA: Diagnosis not present
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
## 2020-05-30 NOTE — Telephone Encounter (Signed)
[name] was in today seen by AM. He left a detailed note and instructions to pass to you. He stated he turned 50 and his insurance changed also  employer dropped him. He had to switch to supplemental policies.([employer]). States it's terrible.He is requesting Good Rx going forward which is [facility] [address] [place] [address] # [phone] he wants all the meds filled in a 3 mos supply. Prozac, Lamotigine ,Gabapentin, Clonazepam Ritalin and Seroquel.

## 2020-06-02 ENCOUNTER — Telehealth: Payer: Self-pay | Admitting: Psychiatry

## 2020-06-03 NOTE — Telephone Encounter (Signed)
Error

## 2020-06-06 NOTE — Telephone Encounter (Signed)
Rtc to patient and he currently doesn't need any refills at this time. Will be due in November.   Rx's will be sent end of this month 10/29

## 2020-06-24 ENCOUNTER — Telehealth: Payer: Self-pay | Admitting: Psychiatry

## 2020-06-24 ENCOUNTER — Other Ambulatory Visit: Payer: Self-pay

## 2020-06-24 DIAGNOSIS — F4010 Social phobia, unspecified: Secondary | ICD-10-CM

## 2020-06-24 DIAGNOSIS — F3130 Bipolar disorder, current episode depressed, mild or moderate severity, unspecified: Secondary | ICD-10-CM

## 2020-06-24 DIAGNOSIS — Z8659 Personal history of other mental and behavioral disorders: Secondary | ICD-10-CM

## 2020-06-24 MED ORDER — LAMOTRIGINE 200 MG PO TABS: 200.0000 mg | ORAL_TABLET | Freq: Every day | ORAL | 1 refills | Status: DC

## 2020-06-24 MED ORDER — QUETIAPINE FUMARATE 400 MG PO TABS: 800.0000 mg | ORAL_TABLET | Freq: Every day | ORAL | 3 refills | Status: DC

## 2020-06-24 MED ORDER — FLUOXETINE HCL 40 MG PO CAPS: 80.0000 mg | ORAL_CAPSULE | Freq: Every day | ORAL | 3 refills | Status: DC

## 2020-06-24 NOTE — Telephone Encounter (Signed)
Pt lm on vm stating Traci was going to sent Rx for meds to [facility] [address] [place] [address]. # [phone] to get meds on a cycle. Just a friendly reminder, he said. Pt contact # [phone]

## 2020-06-24 NOTE — Telephone Encounter (Signed)
Noted, I informed in at the end of the month and appreciate his reminder. Will send those.

## 2020-06-24 NOTE — Telephone Encounter (Signed)
Will cancel his Ritalin at [facility] and pend for Dr. Jennelle Human to send along with Clonazepam and Gabapentin.   His Fluoxetine, Lamictal and Seroquel have been sent.

## 2020-06-27 ENCOUNTER — Other Ambulatory Visit: Payer: Self-pay

## 2020-06-27 ENCOUNTER — Ambulatory Visit (INDEPENDENT_AMBULATORY_CARE_PROVIDER_SITE_OTHER): Payer: Medicare Other | Admitting: Psychiatry

## 2020-06-27 DIAGNOSIS — Z8659 Personal history of other mental and behavioral disorders: Secondary | ICD-10-CM

## 2020-06-27 DIAGNOSIS — F3130 Bipolar disorder, current episode depressed, mild or moderate severity, unspecified: Secondary | ICD-10-CM | POA: Diagnosis not present

## 2020-06-27 DIAGNOSIS — F341 Dysthymic disorder: Secondary | ICD-10-CM | POA: Diagnosis not present

## 2020-06-27 DIAGNOSIS — Z636 Dependent relative needing care at home: Secondary | ICD-10-CM | POA: Diagnosis not present

## 2020-06-27 MED ORDER — CLONAZEPAM 1 MG PO TABS: ORAL_TABLET | ORAL | 1 refills | Status: DC

## 2020-06-27 MED ORDER — METHYLPHENIDATE HCL 20 MG PO TABS
20.0000 mg | ORAL_TABLET | Freq: Three times a day (TID) | ORAL | 0 refills | Status: DC
Start: 2020-06-27 — End: 2020-09-28

## 2020-06-27 MED ORDER — GABAPENTIN 400 MG PO CAPS: ORAL_CAPSULE | ORAL | 1 refills | Status: DC
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
## 2020-07-25 ENCOUNTER — Ambulatory Visit: Payer: Medicare Other | Admitting: Psychiatry

## 2020-09-05 ENCOUNTER — Other Ambulatory Visit: Payer: Self-pay

## 2020-09-05 ENCOUNTER — Ambulatory Visit (INDEPENDENT_AMBULATORY_CARE_PROVIDER_SITE_OTHER): Payer: Medicare Other | Admitting: Psychiatry

## 2020-09-05 DIAGNOSIS — Z8659 Personal history of other mental and behavioral disorders: Secondary | ICD-10-CM | POA: Diagnosis not present

## 2020-09-05 DIAGNOSIS — F341 Dysthymic disorder: Secondary | ICD-10-CM | POA: Diagnosis not present

## 2020-09-05 DIAGNOSIS — Z636 Dependent relative needing care at home: Secondary | ICD-10-CM

## 2020-09-05 DIAGNOSIS — F3130 Bipolar disorder, current episode depressed, mild or moderate severity, unspecified: Secondary | ICD-10-CM

## 2020-09-05 NOTE — Progress Notes (Signed)
Psychotherapy Progress Note [facility] [name], PhD LP  Patient ID: Mark Hull     MRN: [id number] Therapy format: Individual psychotherapy Date: 09/05/2020      Start: 1:09p     Stop: 1:57p     Time Spent: 48 min Location: In-person   Session narrative (presenting needs, interim history, self-report of stressors and symptoms, applications of prior therapy, status changes, and interventions made in session) Still dealing with Mark Hull having Parkinson's and being in denial.  Got her to go to a chiropractor today to address sequelae of her growing spasticity, consistent with her illusion that she only has a back problem.  Has researched the possibility of her having restorative brain surgery, though he does realize there is no path for her to consent.  Has checked with a family friend, who says he has seen her tremor but that it stabilizes while she is working.    Has refigured the house now to use the living room as a bedroom, to accommodate the king bed they like, since downsizing and finding the master bedroom tight.  Have replaced with a dual-adjustable king, which Mark Hull is beginning to get used to, but she also wants to move back into the bedroom, which seems unrealistic to him.  Overall, things do get dismal, thinks daily about dying.  Has reasons to live in taking care of [facility] and in staying for his dogs.  Worries about the possibility of a race war breaking out, too, and has some weapons stashed in case of a need to defend against a mob or a police invasion of his home.  Respectfully challenged whether those things are actually any likelihood where and how they live, and encouraged not to indulge fantastic worries, since dramatic thinking begets more dramatic thinking, especially for a bipolar trauma survivor facing loss and ominous numerical age.  Ensure weapons are at least safely stowed and require some effort to use, renewed pledge to be in touch if suicidal  impulses rise.    Constructively, finds it helpful for Mark Hull to be relying more on him for some things.  Grants him a more ready sense of purpose, and may be some foundation for accepting further help and diagnosis.  Affirmed and encouraged.  Is also doing Atkins diet again and has lost 8 lbs.  Discussed pitfalls of protein-heavy approach to dieting, encouraged enough fat content to level energy and maintain brain integrity.  Therapeutic modalities: Cognitive Behavioral Therapy, Solution-Oriented/Positive Psychology and Ego-Supportive  Mental Status/Observations:  Appearance:   Casual     Behavior:  Appropriate  Motor:  Normal  Speech/Language:   Clear and Coherent  Affect:  Appropriate  Mood:  dysthymic  Thought process:  normal  Thought content:    mild paranoia  Sensory/Perceptual disturbances:    WNL  Orientation:  Fully oriented  Attention:  Good    Concentration:  Good  Memory:  WNL  Insight:    Good  Judgment:   Fair  Impulse Control:  Good   Risk Assessment: Danger to Self: thoughts of death, no plan/intent Self-injurious Behavior: No Danger to Others: only if credibly provoked Physical Aggression / Violence: No Duty to Warn: No Access to Firearms a concern: Yes, pledge of safety  Assessment of progress:  stabilized  Diagnosis:   ICD-10-CM   1. Bipolar I disorder, most recent episode depressed (HCC)  F31.30   2. Severe early onset dysthymic disorder, in partial remission, with anxious distress, with intermittent major depressive episodes,  with current episode  F34.1   3. Caregiver stress  Z63.6   4. History of posttraumatic stress disorder (PTSD)  Z86.59    Plan:  . Ensure security of weapons . Challenge fantasies of dramatic social conflict -- they are temptations to displace stress/pain and justify taking powerful action to feel better more than actual risk . Continue efforts to recruit wife's attention to her actual symptoms  . Other recommendations/advice as  may be noted above . Continue to utilize previously learned skills ad lib . Maintain medication as prescribed and work faithfully with relevant prescriber(s) if any changes are desired or seem indicated . Call the clinic on-call service, present to ER, or call 911 if any life-threatening psychiatric crisis Return for time as available. . Already scheduled visit in this office 10/03/2020.  Robley Fries, PhD [name], PhD LP Clinical Psychologist, [facility] [address], [address] [place], [place] [address] [phone]

## 2020-09-23 ENCOUNTER — Telehealth: Payer: Self-pay | Admitting: Psychiatry

## 2020-09-23 NOTE — Telephone Encounter (Signed)
Pt would like a refill on Ritalin 20mg  called in to in [place], [place].

## 2020-09-27 NOTE — Telephone Encounter (Signed)
Pt called and left another message asking for his ritalin to be sent to [facility] in [place]

## 2020-09-28 ENCOUNTER — Other Ambulatory Visit: Payer: Self-pay | Admitting: Psychiatry

## 2020-09-28 MED ORDER — METHYLPHENIDATE HCL 20 MG PO TABS
20.0000 mg | ORAL_TABLET | Freq: Three times a day (TID) | ORAL | 0 refills | Status: DC
Start: 2020-09-28 — End: 2020-12-08

## 2020-09-28 NOTE — Telephone Encounter (Signed)
sent 

## 2020-10-03 ENCOUNTER — Ambulatory Visit: Payer: Medicare Other | Admitting: Psychiatry

## 2020-10-17 ENCOUNTER — Ambulatory Visit: Payer: Medicare Other | Admitting: Psychiatry

## 2020-10-25 ENCOUNTER — Ambulatory Visit: Payer: Medicare Other | Admitting: Psychiatry

## 2020-11-16 ENCOUNTER — Ambulatory Visit (INDEPENDENT_AMBULATORY_CARE_PROVIDER_SITE_OTHER): Payer: Medicare Other | Admitting: Psychiatry

## 2020-11-16 ENCOUNTER — Other Ambulatory Visit: Payer: Self-pay

## 2020-11-16 DIAGNOSIS — Z636 Dependent relative needing care at home: Secondary | ICD-10-CM | POA: Diagnosis not present

## 2020-11-16 DIAGNOSIS — Z8659 Personal history of other mental and behavioral disorders: Secondary | ICD-10-CM | POA: Diagnosis not present

## 2020-11-16 DIAGNOSIS — F3130 Bipolar disorder, current episode depressed, mild or moderate severity, unspecified: Secondary | ICD-10-CM

## 2020-11-16 DIAGNOSIS — F341 Dysthymic disorder: Secondary | ICD-10-CM

## 2020-11-16 DIAGNOSIS — F4010 Social phobia, unspecified: Secondary | ICD-10-CM | POA: Diagnosis not present
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
## 2020-12-08 ENCOUNTER — Encounter: Payer: Self-pay | Admitting: Psychiatry

## 2020-12-08 ENCOUNTER — Ambulatory Visit (INDEPENDENT_AMBULATORY_CARE_PROVIDER_SITE_OTHER): Payer: Medicare Other | Admitting: Psychiatry

## 2020-12-08 ENCOUNTER — Other Ambulatory Visit: Payer: Self-pay

## 2020-12-08 DIAGNOSIS — F3130 Bipolar disorder, current episode depressed, mild or moderate severity, unspecified: Secondary | ICD-10-CM | POA: Diagnosis not present

## 2020-12-08 DIAGNOSIS — Z636 Dependent relative needing care at home: Secondary | ICD-10-CM

## 2020-12-08 DIAGNOSIS — Z8659 Personal history of other mental and behavioral disorders: Secondary | ICD-10-CM

## 2020-12-08 DIAGNOSIS — F4010 Social phobia, unspecified: Secondary | ICD-10-CM

## 2020-12-08 DIAGNOSIS — F900 Attention-deficit hyperactivity disorder, predominantly inattentive type: Secondary | ICD-10-CM

## 2020-12-08 MED ORDER — METHYLPHENIDATE HCL 20 MG PO TABS: 20.0000 mg | ORAL_TABLET | Freq: Three times a day (TID) | ORAL | 0 refills | Status: DC

## 2020-12-08 NOTE — Progress Notes (Signed)
Mark Hull [id number] Feb 17, 1956 65 y.o.   Subjective:   Patient ID:  Mark Hull is a 65 y.o. (DOB 12-18-1955) male.  Chief Complaint:  Chief Complaint  Patient presents with  . Follow-up  . Depression  . Anxiety  . ADHD    Anxiety Symptoms include nervous/anxious behavior. Patient reports no confusion, decreased concentration, palpitations or suicidal ideas.    Depression        Associated symptoms include myalgias.  Associated symptoms include no decreased concentration and no suicidal ideas.  Past medical history includes anxiety.    Mark Hull presents to the office today for follow-up of mood anxiety.  seen November 2020.  No meds were changed.  03/2020 appointment with the following noted: No meds were changed Stress wife, Olegario Messier,  [place] PD from 2 doctors and she won't accept the dx or treatment and he feels stuck and anxious over it.  He's trying to move to one level house.   Also stress F died 02/20/20 at 66 yo.   Tolerating meds. No desire to change meds.  12/08/2020 appointment with the following noted:  No SE wth meds and compliant without changes. Doing good overall.   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  Expected to be CA free now.  He had to care for her.   She won't accept dx PD and is slow. Not on meds for it. Disc this stress.   Accepted station in life and let things go and feels better than in a long time.  Doing very well overall.  Things improved with the Concerta added June 2019.  Level of depression is improved not gone.  Depression improved 4/10.  Anxiety manageable.  Sleep is good.  Concerta helps energy.  No significant manic symptoms since here.  No irritability or agitation.  Minimal racing thoughts except when anxious.  Satisfied with the meds.  Past Psychiatric Medication Trials: Viibryd, Wellbutrin, venlafaxine, fluoxetine, paroxetine, sertraline Lithium, gabapentin, quetiapine, Latuda, Abilify, Vraylar side effects,  lamotrigine Concerta, pramipexole, Nuvigil Clonazepam, Ambien  Review of Systems:  Review of Systems  Cardiovascular: Negative for palpitations.  Genitourinary: Negative for difficulty urinating.  Musculoskeletal: Positive for arthralgias and myalgias.  Neurological: Negative for tremors and weakness.  Psychiatric/Behavioral: Positive for depression and dysphoric mood. Negative for agitation, behavioral problems, confusion, decreased concentration, hallucinations, self-injury, sleep disturbance and suicidal ideas. The patient is nervous/anxious. The patient is not hyperactive.     Medications: I have reviewed the patient's current medications.  Current Outpatient Medications  Medication Sig Dispense Refill  . atorvastatin (LIPITOR) 10 MG tablet Take 1 tablet by mouth daily at 6 PM.     . clonazePAM (KLONOPIN) 1 MG tablet TAKE ONE-HALF (1/2) TABLET TWICE A DAY AND TAKE 1 TABLET AT 4 P.M. AND TAKE ONE-HALF (1/2) TABLET AS NEEDED 225 tablet 1  . finasteride (PROSCAR) 5 MG tablet     . FLUoxetine (PROZAC) 40 MG capsule Take 2 capsules (80 mg total) by mouth daily. 180 capsule 3  . gabapentin (NEURONTIN) 400 MG capsule TAKE 2 CAPSULES THREE TIMES A DAY 540 capsule 1  . lamoTRIgine (LAMICTAL) 200 MG tablet Take 1 tablet (200 mg total) by mouth at bedtime. 90 tablet 1  . methylphenidate (RITALIN) 20 MG tablet Take 1 tablet (20 mg total) by mouth 3 (three) times daily with meals. 90 tablet 0  . methylphenidate (RITALIN) 20 MG tablet Take 1 tablet (20 mg total) by mouth 3 (three) times daily  with meals. 90 tablet 0  . Omega-3 Fatty Acids (FISH OIL PO) Take 1 capsule by mouth daily.    . QUEtiapine (SEROQUEL) 400 MG tablet Take 2 tablets (800 mg total) by mouth at bedtime. 180 tablet 3  . methylphenidate (RITALIN) 20 MG tablet Take 1 tablet (20 mg total) by mouth 3 (three) times daily with meals. 270 tablet 0  . zolpidem (AMBIEN) 10 MG tablet Take 1/2 to 1 tablet of ( 10 mg) by mouth at Bedtime as  needed (Patient not taking: Reported on 12/08/2020) 90 tablet 0   No current facility-administered medications for this visit.    Medication Side Effects: None  Allergies:  Allergies  Allergen Reactions  . Wellbutrin [Bupropion] Photosensitivity and Anxiety    Past Medical History:  Diagnosis Date  . Anxiety   . Arthritis   . Bipolar disorder (HCC)   . Depression   . Gait disorder 12/17/2013  . GERD (gastroesophageal reflux disease)   . Primary localized osteoarthritis of right knee 11/06/2016  . PTSD (post-traumatic stress disorder)     Family History  Problem Relation Age of Onset  . Heart failure Father   . Cancer Father   . COPD Mother   . Cancer Sister     Social History   Socioeconomic History  . Marital status: Married    Spouse name: Not on file  . Number of children: Not on file  . Years of education: Not on file  . Highest education level: Not on file  Occupational History  . Not on file  Tobacco Use  . Smoking status: Never Smoker  . Smokeless tobacco: Never Used  Substance and Sexual Activity  . Alcohol use: No  . Drug use: No  . Sexual activity: Not on file  Other Topics Concern  . Not on file  Social History Narrative  . Not on file   Social Determinants of Health   Financial Resource Strain: Not on file  Food Insecurity: Not on file  Transportation Needs: Not on file  Physical Activity: Not on file  Stress: Not on file  Social Connections: Not on file  Intimate Partner Violence: Not on file    Past Medical History, Surgical history, Social history, and Family history were reviewed and updated as appropriate.   Please see review of systems for further details on the patient's review from today.   Objective:   Physical Exam:  There were no vitals taken for this visit.  Physical Exam Constitutional:      General: He is not in acute distress. Musculoskeletal:        General: No deformity.  Neurological:     Mental Status: He is  alert and oriented to person, place, and time.     Cranial Nerves: No dysarthria.     Coordination: Coordination normal.  Psychiatric:        Attention and Perception: Attention and perception normal. He does not perceive auditory or visual hallucinations.        Mood and Affect: Mood is anxious and depressed. Affect is not labile, blunt, angry or inappropriate.        Speech: Speech normal.        Behavior: Behavior normal. Behavior is cooperative.        Thought Content: Thought content normal. Thought content is not paranoid or delusional. Thought content does not include homicidal or suicidal ideation. Thought content does not include homicidal or suicidal plan.        Cognition  and Memory: Cognition and memory normal.        Judgment: Judgment normal.     Comments: Insight fair to good. Overall satisfied with meds depression and anxiety better with meds not gone     Lab Review:     Component Value Date/Time   NA 134 (L) 11/07/2016 0533   K 3.9 11/07/2016 0533   CL 99 (L) 11/07/2016 0533   CO2 26 11/07/2016 0533   GLUCOSE 125 (H) 11/07/2016 0533   BUN 6 11/07/2016 0533   CREATININE 0.99 11/07/2016 0533   CALCIUM 8.4 (L) 11/07/2016 0533   GFRNONAA >60 11/07/2016 0533   GFRAA >60 11/07/2016 0533       Component Value Date/Time   WBC 6.8 11/07/2016 0533   RBC 3.79 (L) 11/07/2016 0533   HGB 11.5 (L) 11/07/2016 0533   HCT 35.0 (L) 11/07/2016 0533   PLT 196 11/07/2016 0533   MCV 92.3 11/07/2016 0533   MCH 30.3 11/07/2016 0533   MCHC 32.9 11/07/2016 0533   RDW 12.9 11/07/2016 0533    No results found for: POCLITH, LITHIUM   No results found for: PHENYTOIN, PHENOBARB, VALPROATE, CBMZ   .res Assessment: Plan:    Bipolar I disorder, most recent episode depressed (HCC) - Plan: methylphenidate (RITALIN) 20 MG tablet  Social anxiety disorder  History of posttraumatic stress disorder (PTSD)  Caregiver stress  Attention deficit hyperactivity disorder (ADHD),  predominantly inattentive type - Plan: methylphenidate (RITALIN) 20 MG tablet  Greater than 50% of face to face time with patient was spent on counseling and coordination of care. We discussed Patient has multiple psychiatric diagnoses as noted.  The last medicine change was the addition of Concerta in June 2019 which further reduced his depressive symptoms and improved his energy and productivity.  It is not ideal to be using that in combination with the benzodiazepines and he is aware of that and trying to minimize the amount of benzodiazepine.  He is tolerating his medications well.  He does not want medicine changes.  No medicine changes today.  He feels fine with the meds but anxious in the afternoon more. Continue the following meds: Concerta 54 mg every morning Quetiapine 400 mg nightly Clonazepam 1/2 tablet twice a day and 1 tablet at 4 PM and 1/2 tablet as needed anxiety Fluoxetine 80 mg daily Lamotrigine 200 mg daily Gabapentin 800 mg 3 times daily  Helps pain and anxiety Mostly stopped Ambien 5 mg nightly as needed insomnia  Discussed the polypharmacy again which is not ideal but has been helpful and he is tolerating it.  Discussed the risks associated.  We discussed the short-term risks associated with benzodiazepines including sedation and increased fall risk among others.  Discussed long-term side effect risk including dependence, potential withdrawal symptoms, and the potential eventual dose-related risk of dementia.  As noted above the patient gets benefit from gabapentin.   We both feel that it is helpful and the benefit outweighs any risks.  Additionally I am quite confident in his anxiety at least as well as his joint pain would worsen markedly off of the medication.  Supportive therapy dealing with wife's new dx PD and her refusal to accept the dx or any treatment.  Disc strategies to help get her engaged in treatment by reframing it as a tremor rather than as PD.  Disc this  in detail.    This is a 30-minute appointment  Follow-up 6 months  Meredith Staggers MD, DFAPA  Please see After Visit  Summary for patient specific instructions.  No future appointments.  No orders of the defined types were placed in this encounter.     -------------------------------

## 2020-12-22 ENCOUNTER — Other Ambulatory Visit: Payer: Self-pay | Admitting: Psychiatry

## 2020-12-22 DIAGNOSIS — Z8659 Personal history of other mental and behavioral disorders: Secondary | ICD-10-CM

## 2020-12-22 DIAGNOSIS — F4010 Social phobia, unspecified: Secondary | ICD-10-CM

## 2020-12-22 DIAGNOSIS — F3130 Bipolar disorder, current episode depressed, mild or moderate severity, unspecified: Secondary | ICD-10-CM

## 2020-12-22 DIAGNOSIS — F900 Attention-deficit hyperactivity disorder, predominantly inattentive type: Secondary | ICD-10-CM

## 2020-12-22 MED ORDER — METHYLPHENIDATE HCL 20 MG PO TABS: 20.0000 mg | ORAL_TABLET | Freq: Three times a day (TID) | ORAL | 0 refills | Status: DC

## 2020-12-22 NOTE — Telephone Encounter (Signed)
Pt would like a refill on methylphenidate 20mg . Please send to [facility] in [place].

## 2021-01-18 ENCOUNTER — Ambulatory Visit (INDEPENDENT_AMBULATORY_CARE_PROVIDER_SITE_OTHER): Payer: Medicare Other | Admitting: Psychiatry

## 2021-01-18 ENCOUNTER — Other Ambulatory Visit: Payer: Self-pay

## 2021-01-18 DIAGNOSIS — Z8659 Personal history of other mental and behavioral disorders: Secondary | ICD-10-CM

## 2021-01-18 DIAGNOSIS — Z636 Dependent relative needing care at home: Secondary | ICD-10-CM

## 2021-01-18 DIAGNOSIS — F4010 Social phobia, unspecified: Secondary | ICD-10-CM

## 2021-01-18 DIAGNOSIS — F3130 Bipolar disorder, current episode depressed, mild or moderate severity, unspecified: Secondary | ICD-10-CM | POA: Diagnosis not present

## 2021-01-18 DIAGNOSIS — F341 Dysthymic disorder: Secondary | ICD-10-CM

## 2021-01-18 NOTE — Progress Notes (Signed)
Psychotherapy Progress Note [facility] [name], PhD LP  Patient ID: SPARROW SANZO     MRN: [id number] Therapy format: Individual psychotherapy Date: 01/18/2021      Start: 10:10a     Stop: 11:00a     Time Spent: 50 min Location: In-person   Session narrative (presenting needs, interim history, self-report of stressors and symptoms, applications of prior therapy, status changes, and interventions made in session) Rescheduled this week after some absence, felt it was time to reestablish.    [name] had her major mouth surgery, plus a 14 inch incision on her arm and some leg donor tissue .  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  Residual articulation issues from grafted tissue.  Sees her as a bit impatient to get back to full normal.    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  Overall, she is quite slowed down, still with unaccepted Parkinson's and an orthopedic problem.  Went through 3 weeks of assisted feedings through NG tube.  Now back doing hair for a week.    Ultimately, not worried right now about money, and he did OK with the hospital experience, rooming in for some hours with Olegario Messier.  It was tremendously hard to be out in the world circulating, and he has been ruminating about lifelong "failure" in recent weeks, feeling profoundly inadequate.  Did take the recommendation to hold hands despite [name]'s trembling, which helped.    Overall, so tired, lonely, sees himself losing the one friend he has in life, in pieces, as she gradually descends into dementia, and the prospect is hellishly lonely.    Supportively confronted rumination, challenged to recognize how many otherwise-paid jobs he is undertaking, compared to the idea of "failure", and pointed out inhumane definition of succeeding and how the real problem is he is expecting miracles he can't pul  off.  Agreed he needs companionship beyond his wife, and personal support beyond therapy.  Discussed Alzheimer's Association support as one possibility, also cancer caregivers' resources if available in his area.  Therapeutic modalities: Cognitive Behavioral Therapy, Solution-Oriented/Positive Psychology, and Ego-Supportive  Mental Status/Observations:  Appearance:   Casual     Behavior:  Appropriate  Motor:  slowed  Speech/Language:   Clear and Coherent and Slow  Affect:  Constricted  Mood:  depressed  Thought process:  normal  Thought content:    Rumination  Sensory/Perceptual disturbances:    WNL  Orientation:  Fully oriented  Attention:  Good    Concentration:  Good  Memory:  grossly intact  Insight:    Fair  Judgment:   Good  Impulse Control:  Good   Risk Assessment: Danger to Self: No, but historically persistent SI Self-injurious Behavior: No Danger to Others: No Physical Aggression / Violence: No Duty to Warn: No Access to Firearms a concern: No  Assessment of progress:  situational setback(s)  Diagnosis:   ICD-10-CM   1. Bipolar I disorder, most recent episode depressed (HCC)  F31.30     2. Caregiver stress  Z63.6     3. Severe early onset dysthymic disorder, in partial remission, with anxious distress, with intermittent major depressive episodes, with current episode  F34.1     4. History of posttraumatic stress disorder (PTSD)  Z86.59     5. Social anxiety disorder  F40.10      Plan:  Self-affirm no "  failure", just superhuman expectations Recommend caregiver support through Alzheimer's Association and/or cancer-related groups Other recommendations/advice as may be noted above Continue to utilize previously learned skills ad lib Maintain medication as prescribed and work faithfully with relevant prescriber(s) if any changes are desired or seem indicated Call the clinic on-call service, present to ER, or call 911 if any life-threatening psychiatric  crisis Return in about 3 weeks (around 02/08/2021). Already scheduled visit in this office 06/05/2021.  Robley Fries, PhD [name], PhD LP Clinical Psychologist, [facility] [address], [address] [place], [place] [address] [phone]

## 2021-02-20 ENCOUNTER — Ambulatory Visit: Payer: Medicare Other | Admitting: Psychiatry

## 2021-03-24 ENCOUNTER — Other Ambulatory Visit: Payer: Self-pay

## 2021-03-24 ENCOUNTER — Telehealth: Payer: Self-pay | Admitting: Psychiatry

## 2021-03-24 DIAGNOSIS — F900 Attention-deficit hyperactivity disorder, predominantly inattentive type: Secondary | ICD-10-CM

## 2021-03-24 NOTE — Telephone Encounter (Signed)
Patient lm requesting a refill on his Ritalin 20 mg a 30 day supply. He uses the [facility] in [place] .Follow up visit scheduled for 10/10.

## 2021-03-24 NOTE — Telephone Encounter (Signed)
Pended.

## 2021-03-26 MED ORDER — METHYLPHENIDATE HCL 20 MG PO TABS: 20.0000 mg | ORAL_TABLET | Freq: Three times a day (TID) | ORAL | 0 refills | Status: DC

## 2021-04-24 ENCOUNTER — Other Ambulatory Visit: Payer: Self-pay

## 2021-04-24 ENCOUNTER — Ambulatory Visit (INDEPENDENT_AMBULATORY_CARE_PROVIDER_SITE_OTHER): Payer: Medicare Other | Admitting: Psychiatry

## 2021-04-24 ENCOUNTER — Telehealth: Payer: Self-pay | Admitting: Psychiatry

## 2021-04-24 DIAGNOSIS — F4010 Social phobia, unspecified: Secondary | ICD-10-CM

## 2021-04-24 DIAGNOSIS — Z636 Dependent relative needing care at home: Secondary | ICD-10-CM

## 2021-04-24 DIAGNOSIS — F341 Dysthymic disorder: Secondary | ICD-10-CM | POA: Diagnosis not present

## 2021-04-24 DIAGNOSIS — Z8659 Personal history of other mental and behavioral disorders: Secondary | ICD-10-CM | POA: Diagnosis not present

## 2021-04-24 DIAGNOSIS — F3130 Bipolar disorder, current episode depressed, mild or moderate severity, unspecified: Secondary | ICD-10-CM

## 2021-04-24 DIAGNOSIS — F900 Attention-deficit hyperactivity disorder, predominantly inattentive type: Secondary | ICD-10-CM

## 2021-04-24 MED ORDER — METHYLPHENIDATE HCL 20 MG PO TABS: 20.0000 mg | ORAL_TABLET | Freq: Three times a day (TID) | ORAL | 0 refills | Status: DC

## 2021-04-24 NOTE — Telephone Encounter (Signed)
Pended.

## 2021-04-24 NOTE — Progress Notes (Signed)
Psychotherapy Progress Note [facility] [name], PhD LP  Patient ID: ARVLE GRABE     MRN: [id number] Therapy format: Individual psychotherapy Date: 04/24/2021      Start: 1:13p     Stop: 2:00p     Time Spent: 47 min Location: In-person   Session narrative (presenting needs, interim history, self-report of stressors and symptoms, applications of prior therapy, status changes, and interventions made in session) 3 months since last seen.  Still problems with [name] not accepting her diagnosis of Parkinson's.  Working up to an eval at [facility] very soon with a movement specialist.  MRIs taken recently confirm muscular problems, not structural, and she is recommended for a second knee replacement.  Hoping the doctor will be able to persuade her.  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  At home, she can't manage chores, he does them all.    Mood "hovers" 4-5/10, can feel like staying in bed some days, but positive future plans to renovate bathroom.  Enjoying coffee and Bible study together, plus some word learning.  Some more energy personally for using fish oil and Balance of Nature supplement.  Affirmed and encouraged.  Mainly discussed tactics for bringing [name] around to addressing Parkinson's.  Therapeutic modalities: Cognitive Behavioral Therapy, Solution-Oriented/Positive Psychology, and Assertiveness/Communication  Mental Status/Observations:  Appearance:   Casual     Behavior:  Appropriate  Motor:  Normal  Speech/Language:   Clear and Coherent  Affect:  Appropriate and Constricted  Mood:  dysthymic  Thought process:  normal  Thought content:    WNL  Sensory/Perceptual disturbances:    WNL  Orientation:  Fully oriented  Attention:  Good    Concentration:  Good  Memory:  WNL  Insight:    Good  Judgment:   Good  Impulse Control:  Good   Risk Assessment: Danger to Self:  No Self-injurious Behavior: No Danger to Others: No Physical Aggression / Violence: No Duty to Warn: No Access to Firearms a concern: No  Assessment of progress:  stabilized  Diagnosis:   ICD-10-CM   1. Bipolar I disorder, most recent episode depressed (HCC)  F31.30     2. Caregiver stress  Z63.6     3. Severe early onset dysthymic disorder, in partial remission, with anxious distress, with intermittent major depressive episodes, with current episode  F34.1     4. History of posttraumatic stress disorder (PTSD)  Z86.59     5. Social anxiety disorder  F40.10      Plan:  Check surgery risk/benefit with the Parkinson's doctor, option to pre-inform that [name] is in denial about her diagnosis and may need special handling Continue encouragement to seek further caregiver support, possibly through church.  At least see what is available written for dementia caregivers. Other recommendations/advice as may be noted above Continue to utilize previously learned skills ad lib Maintain medication as prescribed and work faithfully with relevant prescriber(s) if any changes are desired or seem indicated Call the clinic on-call service, present to ER, or call 911 if any life-threatening psychiatric crisis Return in about 1 month (around 05/25/2021) for time as available. Already scheduled visit in this office 06/05/2021.  Robley Fries, PhD [name], PhD LP Clinical Psychologist, [facility] [address], [address] [place], [place] [address] [phone]

## 2021-04-24 NOTE — Telephone Encounter (Signed)
Mark Hull called to request refill of his Ritalin. He requested a 90 day supply.  I explained to him that it can only be filled 1 month at a time.  He has appt. 06/05/21.  Send to [facility] in [place], [place]

## 2021-05-30 ENCOUNTER — Other Ambulatory Visit: Payer: Self-pay | Admitting: Psychiatry

## 2021-05-30 DIAGNOSIS — F3130 Bipolar disorder, current episode depressed, mild or moderate severity, unspecified: Secondary | ICD-10-CM

## 2021-05-31 NOTE — Telephone Encounter (Signed)
90 day ok?

## 2021-06-05 ENCOUNTER — Ambulatory Visit (INDEPENDENT_AMBULATORY_CARE_PROVIDER_SITE_OTHER): Payer: Medicare Other | Admitting: Psychiatry

## 2021-06-05 ENCOUNTER — Encounter: Payer: Self-pay | Admitting: Psychiatry

## 2021-06-05 ENCOUNTER — Other Ambulatory Visit: Payer: Self-pay

## 2021-06-05 VITALS — BP 142/87 | HR 69

## 2021-06-05 DIAGNOSIS — F4010 Social phobia, unspecified: Secondary | ICD-10-CM

## 2021-06-05 DIAGNOSIS — R4189 Other symptoms and signs involving cognitive functions and awareness: Secondary | ICD-10-CM | POA: Diagnosis not present

## 2021-06-05 DIAGNOSIS — F900 Attention-deficit hyperactivity disorder, predominantly inattentive type: Secondary | ICD-10-CM

## 2021-06-05 DIAGNOSIS — Z8659 Personal history of other mental and behavioral disorders: Secondary | ICD-10-CM

## 2021-06-05 DIAGNOSIS — F3130 Bipolar disorder, current episode depressed, mild or moderate severity, unspecified: Secondary | ICD-10-CM | POA: Diagnosis not present

## 2021-06-05 DIAGNOSIS — R7989 Other specified abnormal findings of blood chemistry: Secondary | ICD-10-CM

## 2021-06-05 MED ORDER — CYANOCOBALAMIN 500 MCG PO TABS: 500.0000 ug | ORAL_TABLET | Freq: Every day | ORAL | 1 refills | Status: AC

## 2021-06-05 MED ORDER — L-METHYLFOLATE CALCIUM 15 MG PO TABS
15.0000 mg | ORAL_TABLET | Freq: Every day | ORAL | 5 refills | Status: DC
Start: 2021-06-05 — End: 2021-07-31

## 2021-06-05 NOTE — Patient Instructions (Addendum)
Disc the off-label use of N-Acetylcysteine (NAC) at 600 mg capsule 1-2 daily to help with mild cognitive problems.  It can be combined with a B-complex vitamin (or specifically with B-12 and methylfolate)  have been shown to sometimes enhance the effect.

## 2021-06-05 NOTE — Progress Notes (Signed)
[name] [id number] 1956/03/11 65 y.o.   Subjective:   Patient ID:  Mark Hull is a 65 y.o. (DOB [birth year]) male.  Chief Complaint:  Chief Complaint  Patient presents with   Follow-up   Depression    Anxiety Symptoms include decreased concentration and nervous/anxious behavior. Patient reports no confusion, palpitations or suicidal ideas.    Depression        Associated symptoms include decreased concentration and myalgias.  Associated symptoms include no suicidal ideas.  Past medical history includes anxiety.   [name] presents to the office today for follow-up of mood anxiety.  seen November 2020.  No meds were changed.  03/2020 appointment with the following noted: No meds were changed Stress wife, Olegario Messier,  [place] PD from 2 doctors and she won't accept the dx or treatment and he feels stuck and anxious over it.  He's trying to move to one level house.   Also stress F died 02/16/2020 at 66 yo.   Tolerating meds. No desire to change meds.  12/08/2020 appointment with the following noted:  No SE wth meds and compliant without changes. Doing good overall.   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  Expected to be CA free now.  He had to care for her.   She won't accept dx PD and is slow. Not on meds for it. Disc this stress.   Accepted station in life and let things go and feels better than in a long time. Plan: No medicine changes today.  He feels fine with the meds but anxious in the afternoon more. Continue the following meds: Concerta 54 mg every morning Quetiapine 400 mg nightly Clonazepam 1/2 tablet twice a day and 1 tablet at 4 PM and 1/2 tablet as needed anxiety Fluoxetine 80 mg daily Lamotrigine 200 mg daily Gabapentin 800 mg 3 times daily  Helps pain and anxiety Mostly stopped Ambien 5 mg nightly as needed insomnia  06/05/21 appt noted: Doing well and enjoying things.  Active and productive.  Being outside helps. No SE but some wordfinding  problems.  Doing very well overall.  Things improved with the Concerta added June 2019.  Level of depression is improved not gone.  Depression improved 4/10.  Anxiety manageable.  Sleep is good.  Concerta helps energy.  No significant manic symptoms since here.  No irritability or agitation.  Minimal racing thoughts except when anxious.  Satisfied with the meds.  Past Psychiatric Medication Trials: Viibryd, Wellbutrin, venlafaxine, fluoxetine, paroxetine, sertraline Lithium, gabapentin, quetiapine, Latuda, Abilify, Vraylar side effects, lamotrigine Concerta, pramipexole, Nuvigil Clonazepam, Ambien  Review of Systems:  Review of Systems  Cardiovascular:  Negative for palpitations.  Genitourinary:  Negative for difficulty urinating.  Musculoskeletal:  Positive for arthralgias and myalgias.  Neurological:  Negative for tremors and weakness.  Psychiatric/Behavioral:  Positive for decreased concentration and dysphoric mood. Negative for agitation, behavioral problems, confusion, hallucinations, self-injury, sleep disturbance and suicidal ideas. The patient is nervous/anxious. The patient is not hyperactive.    Medications: I have reviewed the patient's current medications.  Current Outpatient Medications  Medication Sig Dispense Refill   atorvastatin (LIPITOR) 10 MG tablet Take 1 tablet by mouth daily at 6 PM.      clonazePAM (KLONOPIN) 1 MG tablet TAKE 1/2 TABLET BY MOUTH TWICE A DAY TAKE ONE TABLET BY MOUTH DAILY AT 4PM AND TAKE 1/2 TABLET BY MOUTH DAILY AS NEEDED 225 tablet 1   finasteride (PROSCAR) 5 MG tablet  FLUoxetine (PROZAC) 40 MG capsule Take 2 capsules (80 mg total) by mouth daily. 180 capsule 3   gabapentin (NEURONTIN) 400 MG capsule TAKE TWO CAPSULES BY MOUTH THREE TIMES A DAY 540 capsule 1   L-methylfolate Calcium 15 MG TABS Take 15 mg by mouth daily. 30 tablet 5   lamoTRIgine (LAMICTAL) 200 MG tablet Take 1 tablet (200 mg total) by mouth at bedtime. 90 tablet 1    methylphenidate (RITALIN) 20 MG tablet Take 1 tablet (20 mg total) by mouth 3 (three) times daily with meals. 90 tablet 0   methylphenidate (RITALIN) 20 MG tablet Take 1 tablet (20 mg total) by mouth 3 (three) times daily with meals. 90 tablet 0   methylphenidate (RITALIN) 20 MG tablet Take 1 tablet (20 mg total) by mouth 3 (three) times daily with meals. 90 tablet 0   Omega-3 Fatty Acids (FISH OIL PO) Take 1 capsule by mouth daily.     QUEtiapine (SEROQUEL) 400 MG tablet Take 2 tablets (800 mg total) by mouth at bedtime. 180 tablet 3   vitamin B-12 (CYANOCOBALAMIN) 500 MCG tablet Take 1 tablet (500 mcg total) by mouth daily. 90 tablet 1   zolpidem (AMBIEN) 10 MG tablet Take 1/2 to 1 tablet of ( 10 mg) by mouth at Bedtime as needed (Patient not taking: No sig reported) 90 tablet 0   No current facility-administered medications for this visit.    Medication Side Effects: None  Allergies:  Allergies  Allergen Reactions   Wellbutrin [Bupropion] Photosensitivity and Anxiety    Past Medical History:  Diagnosis Date   Anxiety    Arthritis    Bipolar disorder (HCC)    Depression    Gait disorder 12/17/2013   GERD (gastroesophageal reflux disease)    Primary localized osteoarthritis of right knee 11/06/2016   PTSD (post-traumatic stress disorder)     Family History  Problem Relation Age of Onset   Heart failure Father    Cancer Father    COPD Mother    Cancer Sister     Social History   Socioeconomic History   Marital status: Married    Spouse name: Not on file   Number of children: Not on file   Years of education: Not on file   Highest education level: Not on file  Occupational History   Not on file  Tobacco Use   Smoking status: Never   Smokeless tobacco: Never  Substance and Sexual Activity   Alcohol use: No   Drug use: No   Sexual activity: Not on file  Other Topics Concern   Not on file  Social History Narrative   Not on file   Social Determinants of Health    Financial Resource Strain: Not on file  Food Insecurity: Not on file  Transportation Needs: Not on file  Physical Activity: Not on file  Stress: Not on file  Social Connections: Not on file  Intimate Partner Violence: Not on file    Past Medical History, Surgical history, Social history, and Family history were reviewed and updated as appropriate.   Please see review of systems for further details on the patient's review from today.   Objective:   Physical Exam:  BP (!) 142/87   Pulse 69   Physical Exam Constitutional:      General: He is not in acute distress. Musculoskeletal:        General: No deformity.  Neurological:     Mental Status: He is alert and oriented to person,  place, and time.     Cranial Nerves: No dysarthria.     Coordination: Coordination normal.  Psychiatric:        Attention and Perception: Attention and perception normal. He does not perceive auditory or visual hallucinations.        Mood and Affect: Mood is anxious and depressed. Affect is not labile, blunt, angry or inappropriate.        Speech: Speech normal.        Behavior: Behavior normal. Behavior is cooperative.        Thought Content: Thought content normal. Thought content is not paranoid or delusional. Thought content does not include homicidal or suicidal ideation. Thought content does not include homicidal or suicidal plan.        Cognition and Memory: Cognition normal. He exhibits impaired recent memory.        Judgment: Judgment normal.     Comments: Insight fair to good. Overall satisfied with meds depression and anxiety better with meds not gone STM limited to word-finding    Lab Review:     Component Value Date/Time   NA 134 (L) 11/07/2016 0533   K 3.9 11/07/2016 0533   CL 99 (L) 11/07/2016 0533   CO2 26 11/07/2016 0533   GLUCOSE 125 (H) 11/07/2016 0533   BUN 6 11/07/2016 0533   CREATININE 0.99 11/07/2016 0533   CALCIUM 8.4 (L) 11/07/2016 0533   GFRNONAA >60 11/07/2016  0533   GFRAA >60 11/07/2016 0533       Component Value Date/Time   WBC 6.8 11/07/2016 0533   RBC 3.79 (L) 11/07/2016 0533   HGB 11.5 (L) 11/07/2016 0533   HCT 35.0 (L) 11/07/2016 0533   PLT 196 11/07/2016 0533   MCV 92.3 11/07/2016 0533   MCH 30.3 11/07/2016 0533   MCHC 32.9 11/07/2016 0533   RDW 12.9 11/07/2016 0533    No results found for: POCLITH, LITHIUM   No results found for: PHENYTOIN, PHENOBARB, VALPROATE, CBMZ   .res Assessment: Plan:    Bipolar I disorder, most recent episode depressed (HCC)  Cognitive change - Plan: L-methylfolate Calcium 15 MG TABS, vitamin B-12 (CYANOCOBALAMIN) 500 MCG tablet  History of posttraumatic stress disorder (PTSD)  Social anxiety disorder  Attention deficit hyperactivity disorder (ADHD), predominantly inattentive type  Low testosterone  Greater than 50% of face to face time with patient was spent on counseling and coordination of care. We discussed Patient has multiple psychiatric diagnoses as noted.  The last medicine change was the addition of Concerta in June 2019 which further reduced his depressive symptoms and improved his energy and productivity.  It is not ideal to be using that in combination with the benzodiazepines and he is aware of that and trying to minimize the amount of benzodiazepine.  He is tolerating his medications well.  He does not want medicine changes.  No medicine changes today.  He feels fine with the meds but anxious in the afternoon more. Continue the following meds: Concerta 54 mg every morning Quetiapine 400 mg nightly Clonazepam 1/2 tablet twice a day and 1 tablet at 4 PM and 1/2 tablet as needed anxiety Fluoxetine 80 mg daily Lamotrigine 200 mg daily Gabapentin 800 mg 3 times daily  Helps pain and anxiety Mostly stopped Ambien 5 mg nightly as needed insomnia  Discussed the polypharmacy again which is not ideal but has been helpful and he is tolerating it.  Discussed the risks associated.  Disc  the off-label use of N-Acetylcysteine (NAC) at 600  mg capsule 1-2 daily to help with mild cognitive problems.  It can be combined with a B-complex vitamin (or specifically with B-12 and methylfolate)  have been shown to sometimes enhance the effect.  We discussed the short-term risks associated with benzodiazepines including sedation and increased fall risk among others.  Discussed long-term side effect risk including dependence, potential withdrawal symptoms, and the potential eventual dose-related risk of dementia.  As noted above the patient gets benefit from gabapentin.   We both feel that it is helpful and the benefit outweighs any risks.  Additionally I am quite confident in his anxiety at least as well as his joint pain would worsen markedly off of the medication.  Supportive therapy dealing with wife's new dx PD and her refusal to accept the dx or any treatment.  Disc strategies to help get her engaged in treatment by reframing it as a tremor rather than as PD.  Disc this in detail.    This is a 30-minute appointment  Follow-up 6 months  Meredith Staggers MD, DFAPA  Please see After Visit Summary for patient specific instructions.  Future Appointments  Date Time Provider Department Center  06/12/2021 11:00 AM Robley Fries, PhD [facility] None    No orders of the defined types were placed in this encounter.     -------------------------------

## 2021-06-12 ENCOUNTER — Ambulatory Visit (INDEPENDENT_AMBULATORY_CARE_PROVIDER_SITE_OTHER): Payer: Medicare Other | Admitting: Psychiatry

## 2021-06-12 ENCOUNTER — Other Ambulatory Visit: Payer: Self-pay

## 2021-06-12 DIAGNOSIS — Z8659 Personal history of other mental and behavioral disorders: Secondary | ICD-10-CM

## 2021-06-12 DIAGNOSIS — Z636 Dependent relative needing care at home: Secondary | ICD-10-CM | POA: Diagnosis not present

## 2021-06-12 DIAGNOSIS — F3130 Bipolar disorder, current episode depressed, mild or moderate severity, unspecified: Secondary | ICD-10-CM

## 2021-06-12 DIAGNOSIS — F341 Dysthymic disorder: Secondary | ICD-10-CM | POA: Diagnosis not present

## 2021-06-12 DIAGNOSIS — F4010 Social phobia, unspecified: Secondary | ICD-10-CM
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
## 2021-06-25 ENCOUNTER — Other Ambulatory Visit: Payer: Self-pay | Admitting: Psychiatry

## 2021-06-25 DIAGNOSIS — Z8659 Personal history of other mental and behavioral disorders: Secondary | ICD-10-CM

## 2021-06-25 DIAGNOSIS — F3130 Bipolar disorder, current episode depressed, mild or moderate severity, unspecified: Secondary | ICD-10-CM

## 2021-06-25 DIAGNOSIS — F4010 Social phobia, unspecified: Secondary | ICD-10-CM

## 2021-06-28 ENCOUNTER — Telehealth: Payer: Self-pay | Admitting: Psychiatry

## 2021-06-28 ENCOUNTER — Other Ambulatory Visit: Payer: Self-pay

## 2021-06-28 DIAGNOSIS — F3130 Bipolar disorder, current episode depressed, mild or moderate severity, unspecified: Secondary | ICD-10-CM

## 2021-06-28 DIAGNOSIS — F900 Attention-deficit hyperactivity disorder, predominantly inattentive type: Secondary | ICD-10-CM

## 2021-06-28 MED ORDER — METHYLPHENIDATE HCL 20 MG PO TABS
20.0000 mg | ORAL_TABLET | Freq: Three times a day (TID) | ORAL | 0 refills | Status: DC
Start: 2021-07-26 — End: 2021-09-27

## 2021-06-28 MED ORDER — METHYLPHENIDATE HCL 20 MG PO TABS: 20.0000 mg | ORAL_TABLET | Freq: Three times a day (TID) | ORAL | 0 refills | Status: DC

## 2021-06-28 NOTE — Telephone Encounter (Signed)
Pended.

## 2021-06-28 NOTE — Telephone Encounter (Signed)
Next visit is 12/04/21. Requesting refill on Ritalin 20 mg called to:  [facility] - [place], [place] - [address]  Phone:  [phone]  Fax:  [phone]

## 2021-07-31 ENCOUNTER — Ambulatory Visit (INDEPENDENT_AMBULATORY_CARE_PROVIDER_SITE_OTHER): Payer: Medicare Other | Admitting: Psychiatry

## 2021-07-31 ENCOUNTER — Other Ambulatory Visit: Payer: Self-pay

## 2021-07-31 DIAGNOSIS — F4010 Social phobia, unspecified: Secondary | ICD-10-CM | POA: Diagnosis not present

## 2021-07-31 DIAGNOSIS — F341 Dysthymic disorder: Secondary | ICD-10-CM | POA: Diagnosis not present

## 2021-07-31 DIAGNOSIS — R4189 Other symptoms and signs involving cognitive functions and awareness: Secondary | ICD-10-CM

## 2021-07-31 DIAGNOSIS — F3130 Bipolar disorder, current episode depressed, mild or moderate severity, unspecified: Secondary | ICD-10-CM

## 2021-07-31 DIAGNOSIS — Z636 Dependent relative needing care at home: Secondary | ICD-10-CM | POA: Diagnosis not present

## 2021-07-31 DIAGNOSIS — Z8659 Personal history of other mental and behavioral disorders: Secondary | ICD-10-CM

## 2021-07-31 NOTE — Progress Notes (Signed)
Psychotherapy Progress Note [facility] [name], PhD LP  Patient ID: [facility])    MRN: [id number] Therapy format: Individual psychotherapy Date: 07/31/2021      Start: 1:15p     Stop: 2:01p     Time Spent: 46 min Location: In-person   Session narrative (presenting needs, interim history, self-report of stressors and symptoms, applications of prior therapy, status changes, and interventions made in session) Wife had knee replacement, and recovery complicated by Parkinson's, but doing more heel-toe walking than she was before.  Very noticeable relapse in sxs when she took 2 days off PD medication, then she resumed, even though she still doesn't believe she has PD.  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  Tongue surgery itself involved 8 hours to remove tumor, replace with fatty tissue from arm, do skin graft from leg to arm, and handle tracheostomy.  Support/empathy provided.  Acknowledges it's been lonlier as her mind becomes less able to meet his.  Supports are few now, with father gone, sister occupied fostering an autistic child, and no active friendships.  May be facing Achilles surgery himself, with hope of repairing enough to bowl again.  Has a dental alignment problem now as well for using his Snorex device, and just broke off a crown post, with $3800 cost to replace.  Loss of Cathy's income is also putting them in a pinch, though his disability income covers the bills.  Frustrated with his memory now as well as his loneliness.  Has made a neuro appointment to address losing train of thought and some trouble name-finding.  Has already been trimming back Klonopin use, hopeful of helping it.   Neurontin has been 2400mg  for many years now, est 20, and could be a factor.  Has taken the 30-day sample of methylfolate last offered by Dr. , then couldn't find the Rx to follow up so is off the supplement.   Used to take a small B12 but blood work said his B12 was too high, actually, which is known to be very difficult to make happen through supplementation alone.  Discussed likely culprits in anticonvulsant action, other nutritional factors, and suggested take it up with psychiatry about further weaning suspect meds experimentally.  Meanwhile, advocate best exercise, nutrition, and sleep regulation.  Therapeutic modalities: Cognitive Behavioral Therapy, Solution-Oriented/Positive Psychology, and Ego-Supportive  Mental Status/Observations:  Appearance:   Casual     Behavior:  Appropriate  Motor:  Normal  Speech/Language:   Clear and Coherent  Affect:  Constricted  Mood:  dysthymic  Thought process:  normal  Thought content:    WNL  Sensory/Perceptual disturbances:    WNL  Orientation:  Fully oriented  Attention:  Good    Concentration:  Good  Memory:  WNL  Insight:    Fair  Judgment:   Good  Impulse Control:  Good   Risk Assessment: Danger to Self: No Self-injurious Behavior: No Danger to Others: No Physical Aggression / Violence: No Duty to Warn: No Access to Firearms a concern: No  Assessment of progress:  stabilized  Diagnosis:   ICD-10-CM   1. Bipolar I disorder, most recent episode depressed (HCC)  F31.30     2. Severe early onset dysthymic disorder, in partial remission, with anxious distress, with intermittent major depressive episodes, with current episode  F34.1     3. Caregiver stress  Z63.6     4. Social anxiety disorder  F40.10  5. History of posttraumatic stress disorder (PTSD)  Z86.59     6. Cognitive change  R41.89      Plan:  Refer methylfolate supply question to staff to re-send Rx -- worth continuing trial to see if it helps cognition Refer to Dr. Jennelle Human to assess whether one or both of gabapentin and clonazepam can be trimmed for cognitive benefit.  Likely would require gentle taper. Pursue best health habits Other recommendations/advice as may be  noted above Continue to utilize previously learned skills ad lib Maintain medication as prescribed and work faithfully with relevant prescriber(s) if any changes are desired or seem indicated Call the clinic on-call service, 988/hotline, 911, or present to [facility] or ER if any life-threatening psychiatric crisis Return for time as available. Already scheduled visit in this office 12/04/2021.  Robley Fries, PhD [name], PhD LP Clinical Psychologist, [facility] [address] [place], [place] [address] [phone]

## 2021-08-03 MED ORDER — L-METHYLFOLATE CALCIUM 15 MG PO TABS: 15.0000 mg | ORAL_TABLET | Freq: Every day | ORAL | 5 refills | Status: AC

## 2021-08-25 ENCOUNTER — Telehealth: Payer: Self-pay | Admitting: Psychiatry

## 2021-08-25 NOTE — Telephone Encounter (Signed)
Next visit is 12/04/21. Requesting a RX for Ritalin 20 mg called to:  [id number] - [place], [place] - [address]  Phone:  [phone]  Fax:  [phone]

## 2021-08-25 NOTE — Telephone Encounter (Signed)
Pt has rx at pharmacy 

## 2021-09-25 ENCOUNTER — Ambulatory Visit: Payer: Medicare Other | Admitting: Psychiatry

## 2021-09-27 ENCOUNTER — Telehealth: Payer: Self-pay | Admitting: Psychiatry

## 2021-09-27 ENCOUNTER — Other Ambulatory Visit: Payer: Self-pay

## 2021-09-27 DIAGNOSIS — F3130 Bipolar disorder, current episode depressed, mild or moderate severity, unspecified: Secondary | ICD-10-CM

## 2021-09-27 DIAGNOSIS — F900 Attention-deficit hyperactivity disorder, predominantly inattentive type: Secondary | ICD-10-CM

## 2021-09-27 MED ORDER — METHYLPHENIDATE HCL 20 MG PO TABS: 20.0000 mg | ORAL_TABLET | Freq: Three times a day (TID) | ORAL | 0 refills | Status: DC

## 2021-09-27 NOTE — Telephone Encounter (Signed)
Pt lm that he needs a refill on his ritalin 20 ng 3 x d. Please send to [facility] in [place]

## 2021-09-27 NOTE — Telephone Encounter (Signed)
Pended.

## 2021-09-28 ENCOUNTER — Other Ambulatory Visit: Payer: Self-pay | Admitting: Psychiatry

## 2021-09-28 DIAGNOSIS — F4010 Social phobia, unspecified: Secondary | ICD-10-CM

## 2021-09-28 DIAGNOSIS — Z8659 Personal history of other mental and behavioral disorders: Secondary | ICD-10-CM

## 2021-10-10 ENCOUNTER — Institutional Professional Consult (permissible substitution): Payer: Medicare Other | Admitting: Neurology

## 2021-10-19 ENCOUNTER — Encounter: Payer: Self-pay | Admitting: Neurology

## 2021-10-19 ENCOUNTER — Ambulatory Visit: Payer: Medicare Other | Admitting: Neurology

## 2021-10-19 ENCOUNTER — Telehealth: Payer: Self-pay | Admitting: *Deleted

## 2021-10-19 NOTE — Telephone Encounter (Signed)
Pt no showed appt today

## 2021-10-26 ENCOUNTER — Telehealth: Payer: Self-pay | Admitting: Psychiatry

## 2021-10-26 NOTE — Telephone Encounter (Signed)
Next visit is 12/04/21. Requesting refill on Ritalin 20 mg called to: ? ?[id number] - [place], [place] - [address] ? ?Phone:  [phone]  ?Fax:  [phone]  ? ? ? ? ? ?

## 2021-10-27 ENCOUNTER — Other Ambulatory Visit: Payer: Self-pay

## 2021-10-27 NOTE — Telephone Encounter (Signed)
Patient has a RF available at the requested pharmacy. He said he also verified there was a RF available. I asked him to call pharmacy and let us know if there was a problem.  ?

## 2021-11-08 ENCOUNTER — Telehealth: Payer: Self-pay | Admitting: Psychiatry

## 2021-11-08 NOTE — Telephone Encounter (Signed)
After talking with patient he decided he would go another month on the Ritalin before deciding, since he has been on this for a long period of time.  ?

## 2021-11-08 NOTE — Telephone Encounter (Signed)
Pt LVM @ 10:37a.  He says he sent a letter to Dr. Jennelle Human asking to come off Ritalin.  He says it is making him more anxious.  He wants to know if he can taper off?  He just have a lot of life stressors going on. ? ?Next appt 4/10 ?

## 2021-11-13 ENCOUNTER — Telehealth: Payer: Self-pay | Admitting: Psychiatry

## 2021-11-13 NOTE — Telephone Encounter (Signed)
Called patient to give him the weaning recommendations and he said he thought he was just having a period of anxiety with a lot of things going on, but he is much better now and he is electing to continue his current dosing of Ritalin. He is aware that should he change his mind that he cannot abruptly stop it and will call for weaning instructions.  ?

## 2021-11-13 NOTE — Telephone Encounter (Signed)
Patient sent letter complaining of anxiety and believing that the Ritalin is contributing to his anxiety.  He is currently on Ritalin 20 mg 3 times daily.  He has withdrawal when he abruptly stops it but would like to wean off. ? ?Reduce Ritalin from 20 mg tablets 1 3 times daily to one half 3 times daily for 10 days, then one half twice daily for 10 days then he can stop it. ?

## 2021-11-13 NOTE — Telephone Encounter (Signed)
LVM for patient to RC.

## 2021-11-13 NOTE — Telephone Encounter (Signed)
Noted  

## 2021-11-27 ENCOUNTER — Other Ambulatory Visit: Payer: Self-pay | Admitting: Psychiatry

## 2021-11-27 DIAGNOSIS — F3130 Bipolar disorder, current episode depressed, mild or moderate severity, unspecified: Secondary | ICD-10-CM

## 2021-12-04 ENCOUNTER — Ambulatory Visit (INDEPENDENT_AMBULATORY_CARE_PROVIDER_SITE_OTHER): Payer: Medicare Other | Admitting: Psychiatry

## 2021-12-04 ENCOUNTER — Encounter: Payer: Self-pay | Admitting: Psychiatry

## 2021-12-04 DIAGNOSIS — F341 Dysthymic disorder: Secondary | ICD-10-CM

## 2021-12-04 DIAGNOSIS — Z8659 Personal history of other mental and behavioral disorders: Secondary | ICD-10-CM

## 2021-12-04 DIAGNOSIS — F3130 Bipolar disorder, current episode depressed, mild or moderate severity, unspecified: Secondary | ICD-10-CM

## 2021-12-04 DIAGNOSIS — F4010 Social phobia, unspecified: Secondary | ICD-10-CM | POA: Diagnosis not present

## 2021-12-04 DIAGNOSIS — F900 Attention-deficit hyperactivity disorder, predominantly inattentive type: Secondary | ICD-10-CM | POA: Diagnosis not present

## 2021-12-04 MED ORDER — METHYLPHENIDATE HCL 20 MG PO TABS: 20.0000 mg | ORAL_TABLET | Freq: Three times a day (TID) | ORAL | 0 refills | Status: DC

## 2021-12-04 MED ORDER — QUETIAPINE FUMARATE 400 MG PO TABS: 800.0000 mg | ORAL_TABLET | Freq: Every day | ORAL | 3 refills | Status: DC

## 2021-12-04 MED ORDER — GABAPENTIN 400 MG PO CAPS: ORAL_CAPSULE | ORAL | 1 refills | Status: DC

## 2021-12-04 MED ORDER — CLONAZEPAM 1 MG PO TABS: ORAL_TABLET | ORAL | 5 refills | Status: DC

## 2021-12-04 MED ORDER — LAMOTRIGINE 200 MG PO TABS: 200.0000 mg | ORAL_TABLET | Freq: Every day | ORAL | 1 refills | Status: DC

## 2021-12-04 NOTE — Progress Notes (Signed)
Romeo Apple ?[id number] ?Feb 04, 1956 ?66 y.o. ? ? ?Subjective:  ? ?Patient ID:  KELDRICK TIFFANY is a 66 y.o. (DOB [birth year]) male. ? ?Chief Complaint:  ?Chief Complaint  ?Patient presents with  ? Follow-up  ? Anxiety  ? Depression  ? ADD  ? ? ?Anxiety ?Symptoms include decreased concentration and nervous/anxious behavior. Patient reports no confusion, palpitations or suicidal ideas.  ? ? ?Depression ?       Associated symptoms include decreased concentration and myalgias.  Associated symptoms include no suicidal ideas.  Past medical history includes anxiety.   ?LYAM LAMOTTE presents to the office today for follow-up of mood anxiety. ? ?seen November 2020.  No meds were changed. ? ?03/2020 appointment with the following noted: No meds were changed ?Stress wife, Juliann Pulse,  [place] PD from 2 doctors and she won't accept the dx or treatment and he feels stuck and anxious over it.  He's trying to move to one level house.   ?Also stress F died 03/07/2020 at 67 yo.   ?Tolerating meds. ?No desire to change meds. ? ?12/08/2020 appointment with the following noted: ? No SE wth meds and compliant without changes. ?Doing good overall.   ?1 month ago W CA under tongue and surgery at [facility].  Expected to be CA free now.  He had to care for her.   ?She won't accept dx PD and is slow. Not on meds for it. ?Disc this stress.   ?Accepted station in life and let things go and feels better than in a long time. ?Plan: No medicine changes today.  He feels fine with the meds but anxious in the afternoon more. ?Continue the following meds: ?Concerta 54 mg every morning ?Quetiapine 400 mg nightly ?Clonazepam 1/2 tablet twice a day and 1 tablet at 4 PM and 1/2 tablet as needed anxiety ?Fluoxetine 80 mg daily ?Lamotrigine 200 mg daily ?Gabapentin 800 mg 3 times daily  Helps pain and anxiety ?Mostly stopped Ambien 5 mg nightly as needed insomnia ? ?06/05/21 appt noted: ?Doing well and enjoying things.  Active and productive.  Being outside helps. ?No SE but some  wordfinding problems. ?Doing very well overall.  Things improved with the Concerta added June [id number].  Level of depression is improved not gone.   ?Plan: No med changes ? ?12/04/2021 appointment with the following noted: ?Has been through a period of anxiety since he was last year.  He wondered if the Ritalin was contributing but at the time decided to stay on it. ?Wife with PD and dx CA last year.  TKR and missed a lot of work last year and went through savings.  She is doing better now.   ?Mood ok other than anxious before noted but that's also better.Depression improved 4/10.  Anxiety manageable.  Sleep is good.  ritalin helps energy.  No significant manic symptoms since here.  No irritability or agitation.  Minimal racing thoughts except when anxious.  Satisfied with the meds. ?No SE ? ?Past Psychiatric Medication Trials: Viibryd, Wellbutrin, venlafaxine, fluoxetine, paroxetine, sertraline ?Lithium, gabapentin, quetiapine, Latuda, Abilify, Vraylar side effects, lamotrigine ?Concerta, pramipexole, Nuvigil ?Clonazepam, Ambien ? ?Review of Systems:  ?Review of Systems  ?Cardiovascular:  Negative for palpitations.  ?Genitourinary:  Negative for difficulty urinating.  ?Musculoskeletal:  Positive for arthralgias and myalgias.  ?Neurological:  Negative for tremors.  ?Psychiatric/Behavioral:  Positive for decreased concentration and dysphoric mood. Negative for agitation, behavioral problems, confusion, hallucinations, self-injury, sleep disturbance and suicidal ideas. The patient is nervous/anxious. The patient  is not hyperactive.   ? ?Medications: I have reviewed the patient's current medications. ? ?Current Outpatient Medications  ?Medication Sig Dispense Refill  ? atorvastatin (LIPITOR) 10 MG tablet Take 1 tablet by mouth daily at 6 PM.     ? finasteride (PROSCAR) 5 MG tablet     ? FLUoxetine (PROZAC) 40 MG capsule TAKE TWO CAPSULES BY MOUTH DAILY 180 capsule 3  ? L-methylfolate Calcium 15 MG TABS Take 15 mg by mouth  daily. 30 tablet 5  ? Omega-3 Fatty Acids (FISH OIL PO) Take 1 capsule by mouth daily.    ? vitamin B-12 (CYANOCOBALAMIN) 500 MCG tablet Take 1 tablet (500 mcg total) by mouth daily. 90 tablet 1  ? clonazePAM (KLONOPIN) 1 MG tablet TAKE 1/2 TABLET BY MOUTH TWICE A DAY AND TAKE ONE TABLET BY MOUTH DAILY AT 4PM AND TAKE 1/2 TABLET BY MOUTH EVERY NIGHT AT BEDTIME 120 tablet 5  ? gabapentin (NEURONTIN) 400 MG capsule TAKE TWO CAPSULES BY MOUTH THREE TIMES A DAY 540 capsule 1  ? lamoTRIgine (LAMICTAL) 200 MG tablet Take 1 tablet (200 mg total) by mouth at bedtime. 90 tablet 1  ? [START ON 01/29/2022] methylphenidate (RITALIN) 20 MG tablet Take 1 tablet (20 mg total) by mouth 3 (three) times daily with meals. 90 tablet 0  ? [START ON 01/01/2022] methylphenidate (RITALIN) 20 MG tablet Take 1 tablet (20 mg total) by mouth 3 (three) times daily with meals. 90 tablet 0  ? methylphenidate (RITALIN) 20 MG tablet Take 1 tablet (20 mg total) by mouth 3 (three) times daily with meals. 90 tablet 0  ? QUEtiapine (SEROQUEL) 400 MG tablet Take 2 tablets (800 mg total) by mouth at bedtime. 180 tablet 3  ? zolpidem (AMBIEN) 10 MG tablet Take 1/2 to 1 tablet of ( 10 mg) by mouth at Bedtime as needed (Patient not taking: Reported on 12/04/2021) 90 tablet 0  ? ?No current facility-administered medications for this visit.  ? ? ?Medication Side Effects: None ? ?Allergies:  ?Allergies  ?Allergen Reactions  ? Wellbutrin [Bupropion] Photosensitivity and Anxiety  ? ? ?Past Medical History:  ?Diagnosis Date  ? Anxiety   ? Arthritis   ? Bipolar disorder (HCC)   ? Depression   ? Gait disorder 12/17/2013  ? GERD (gastroesophageal reflux disease)   ? Primary localized osteoarthritis of right knee 11/06/2016  ? PTSD (post-traumatic stress disorder)   ? ? ?Family History  ?Problem Relation Age of Onset  ? Heart failure Father   ? Cancer Father   ? COPD Mother   ? Cancer Sister   ? ? ?Social History  ? ?Socioeconomic History  ? Marital status: Married  ?   Spouse name: Not on file  ? Number of children: Not on file  ? Years of education: Not on file  ? Highest education level: Not on file  ?Occupational History  ? Not on file  ?Tobacco Use  ? Smoking status: Never  ? Smokeless tobacco: Never  ?Substance and Sexual Activity  ? Alcohol use: No  ? Drug use: No  ? Sexual activity: Not on file  ?Other Topics Concern  ? Not on file  ?Social History Narrative  ? Not on file  ? ?Social Determinants of Health  ? ?Financial Resource Strain: Not on file  ?Food Insecurity: Not on file  ?Transportation Needs: Not on file  ?Physical Activity: Not on file  ?Stress: Not on file  ?Social Connections: Not on file  ?Intimate Partner Violence: Not on  file  ? ? ?Past Medical History, Surgical history, Social history, and Family history were reviewed and updated as appropriate.  ? ?Please see review of systems for further details on the patient's review from today.  ? ?Objective:  ? ?Physical Exam:  ?There were no vitals taken for this visit. ? ?Physical Exam ?Constitutional:   ?   General: He is not in acute distress. ?Musculoskeletal:     ?   General: No deformity.  ?Neurological:  ?   Mental Status: He is alert and oriented to person, place, and time.  ?   Cranial Nerves: No dysarthria.  ?   Coordination: Coordination normal.  ?Psychiatric:     ?   Attention and Perception: Attention and perception normal. He does not perceive auditory or visual hallucinations.     ?   Mood and Affect: Mood is anxious and depressed. Affect is not labile, blunt, angry or inappropriate.     ?   Speech: Speech normal.     ?   Behavior: Behavior normal. Behavior is cooperative.     ?   Thought Content: Thought content normal. Thought content is not paranoid or delusional. Thought content does not include homicidal or suicidal ideation. Thought content does not include suicidal plan.     ?   Cognition and Memory: Cognition normal. He exhibits impaired recent memory.     ?   Judgment: Judgment normal.  ?    Comments: Insight fair to good. ?Overall satisfied with meds ?depression and anxiety better with meds not gone ?STM limited to word-finding  ? ? ?Lab Review:  ?   ?Component Value Date/Time  ? NA 134 (L) 03/

## 2021-12-07 ENCOUNTER — Ambulatory Visit: Payer: Medicare Other | Admitting: Psychiatry

## 2021-12-29 ENCOUNTER — Ambulatory Visit: Payer: Medicare Other | Admitting: Psychiatry

## 2022-01-04 ENCOUNTER — Encounter: Payer: Self-pay | Admitting: Gastroenterology

## 2022-01-08 ENCOUNTER — Ambulatory Visit: Payer: Medicare Other | Admitting: Podiatry

## 2022-01-23 ENCOUNTER — Other Ambulatory Visit: Payer: Self-pay

## 2022-01-23 ENCOUNTER — Telehealth: Payer: Self-pay | Admitting: Psychiatry

## 2022-01-23 NOTE — Telephone Encounter (Signed)
Pt lvm at 1:58 pm requesting a refill on his ritalin 20 mg 3x d. The pharmacy is [facility] located at [place]. [address] [place]

## 2022-01-24 ENCOUNTER — Other Ambulatory Visit: Payer: Self-pay

## 2022-01-24 DIAGNOSIS — F3130 Bipolar disorder, current episode depressed, mild or moderate severity, unspecified: Secondary | ICD-10-CM

## 2022-01-24 DIAGNOSIS — F900 Attention-deficit hyperactivity disorder, predominantly inattentive type: Secondary | ICD-10-CM

## 2022-01-24 MED ORDER — METHYLPHENIDATE HCL 20 MG PO TABS: 20.0000 mg | ORAL_TABLET | Freq: Three times a day (TID) | ORAL | 0 refills | Status: DC

## 2022-01-24 NOTE — Telephone Encounter (Signed)
Pended.

## 2022-01-31 ENCOUNTER — Other Ambulatory Visit: Payer: Self-pay

## 2022-01-31 DIAGNOSIS — Z8659 Personal history of other mental and behavioral disorders: Secondary | ICD-10-CM

## 2022-01-31 DIAGNOSIS — F4010 Social phobia, unspecified: Secondary | ICD-10-CM

## 2022-01-31 MED ORDER — CLONAZEPAM 1 MG PO TABS: ORAL_TABLET | ORAL | 5 refills | Status: DC

## 2022-02-09 ENCOUNTER — Ambulatory Visit: Payer: Medicare Other | Admitting: Psychiatry

## 2022-02-12 ENCOUNTER — Encounter: Payer: Medicare Other | Admitting: Gastroenterology

## 2022-02-12 ENCOUNTER — Ambulatory Visit: Payer: Medicare Other | Admitting: Podiatry

## 2022-03-06 ENCOUNTER — Ambulatory Visit: Payer: Medicare Other | Admitting: Neurology

## 2022-03-06 ENCOUNTER — Telehealth: Payer: Self-pay | Admitting: *Deleted

## 2022-03-06 ENCOUNTER — Encounter: Payer: Self-pay | Admitting: Neurology

## 2022-03-06 NOTE — Telephone Encounter (Signed)
Pt no showed for appt. (2nd).

## 2022-03-07 ENCOUNTER — Encounter: Payer: Self-pay | Admitting: Neurology

## 2022-03-23 ENCOUNTER — Telehealth: Payer: Self-pay | Admitting: Psychiatry

## 2022-03-23 NOTE — Telephone Encounter (Signed)
Patient is asking for referral to [facility]. It looks like a referral had been put in for [facility] by another provider in December, but I don't see that he was ever seen. He has concerns about worsening memory.

## 2022-03-23 NOTE — Telephone Encounter (Signed)
Pt LVM @ 1:34p.  He said he would like to get a referral from Dr Jennelle Human to see [facility] in [place].  He said he feels his memory is getting worse and would like to get it checked out.  They need a referral.  Pt said her and Dr Jennelle Human discussed it at his last visit.  Next appt. 10/9

## 2022-04-04 ENCOUNTER — Encounter: Payer: Self-pay | Admitting: Physician Assistant

## 2022-04-05 NOTE — Telephone Encounter (Signed)
It would be most helpful if we have him see a neuropsychologist to have the cognitive testing for the neurologist to review when he is actually seen by neurologist.  So refer him either to neuropsychologist at [facility] neurology or [facility] neurology.  I cannot remember the names of the neuropsychologists.  I think Eula Flax used to be at [facility] in [employer] center but I do not know if he is still around.  He was a Web designer.  So any 1 of those options.  In addition to that he needs to see his primary care doctor to get some basic laboratory tests to rule out the most treatable types of medical problems that can cause memory issues.  The test needed include CBC, urinalysis, comprehensive metabolic panel, TSH, serum B12 and folate.  I can write for those laboratory tests if needed but it would be good for his primary care doctor to take a look at them first to make sure there is no other conditions that could cause memory problems such as cardiac problems, breathing problems, sleep apnea etc.

## 2022-04-06 NOTE — Telephone Encounter (Signed)
Patient notified of recommendations. He said he would start with seeing his PCP and go from there.

## 2022-04-09 NOTE — Telephone Encounter (Signed)
Thank you :)

## 2022-04-18 ENCOUNTER — Encounter: Payer: Self-pay | Admitting: Physician Assistant

## 2022-04-18 ENCOUNTER — Other Ambulatory Visit (INDEPENDENT_AMBULATORY_CARE_PROVIDER_SITE_OTHER): Payer: Medicare Other

## 2022-04-18 ENCOUNTER — Ambulatory Visit (INDEPENDENT_AMBULATORY_CARE_PROVIDER_SITE_OTHER): Payer: Medicare Other | Admitting: Physician Assistant

## 2022-04-18 VITALS — BP 108/69 | HR 77 | Resp 18 | Ht 70.0 in | Wt 221.0 lb

## 2022-04-18 DIAGNOSIS — R413 Other amnesia: Secondary | ICD-10-CM | POA: Diagnosis not present

## 2022-04-18 LAB — TSH: TSH: 1.58 u[IU]/mL (ref 0.35–5.50)

## 2022-04-18 NOTE — Progress Notes (Signed)
Assessment/Plan:   Mark Hull is a very pleasant 66 y.o. year old RH male with a history of social anxiety, depression, bipolar disorder, depression , PTSD, arthritis today for evaluation of memory loss. MoCA today is 29/30, visuospatial 4/5, delayed recall 5/5.Despite the excellent results, he is doctorate degree has to be taken into consideration, and mild cognitive impairment cannot be ruled out.  Work-up is in progress.  Memory difficulties MRI brain without contrast to assess for underlying structural abnormality and assess vascular load  Neurocognitive testing to further evaluate cognitive concerns and determine other underlying cause of memory changes, including potential contribution from sleep, anxiety, or depression  Check TSH Continue to follow psychiatric issues with appropriate specialty Follow up in 1 month  Subjective:   The patient is here alone  How long did patient have memory difficulties?  "Over 6 months, could not come come up with the collar burgundy, then this became more prevalent and frequent ". Then he began to lose a train of thought. He is ok with remembering names of family members, but has trouble with names of TV shows that he knew very well the past.  Patient lives with: Spouse who has moderate PD and cancer (she is cancer free now)  repeats oneself? Endorsed .  Disoriented when walking into a room? Endorsed, although not frequent.  This happens especially when waking up, he felt that he was in a house he lived before and he was in 12 home. Leaving objects in unusual places?  Patient denies   Ambulates  with difficulty? Denies. He has arthritis Recent falls? Endorsed, "when over-reaching for something ".  Denies any dizziness or vertigo. Any head injuries?  Patient denies   History of seizures?   Patient denies   Wandering behavior?  Patient denies   Patient drives?   "Driving is fine, sometimes for a few seconds when I have things on my mind I forget  where I am going ".  He attributes this to situational stress. Any mood changes such irritability agitation?  "I stay pretty even " Any history of depression?: Has a psychiatric follow up, social anxiety, PTSD, and a form of ADD. Sees Dr. Evelene Croon every 6 months  and has a BH Dr. Farrel Demark at [facility] every 6 weeks  Hallucinations?  Patient denies   Paranoia?  Patient denies   Patient reports that he sleeps well without vivid dreams, seldom he has  REM behavior or sleepwalking    History of sleep apnea?  Patient denies   Any hygiene concerns?  Patient denies   Independent of bathing and dressing?  Endorsed  Does the patient needs help with medications? Patient in charge, occasionally he forgets a dose  Who is in charge of the finances?  Patient is in charge    Any changes in appetite?  Patient denies   Patient have trouble swallowing? Patient denies   Does the patient cook?  Patient denies   Any kitchen accidents such as leaving the stove on? Patient denies   Any headaches?  Patient denies   The double vision? Patient denies   Any focal numbness or tingling?  Patient denies   Chronic back pain Patient denies   Unilateral weakness?  Patient denies   Any tremors? Occasionally he has involuntary B hand but no frank tremors  Any incontinence of urine?  Patient denies   Any bowel dysfunction?   Patient denies    History of heavy alcohol intake?  Patient denies  History of heavy tobacco use?  Patient denies   Family history of dementia?  Patient denies. ?mother had some paranoia     Past Medical History:  Diagnosis Date   Anxiety    Arthritis    Bipolar disorder (HCC)    Depression    Gait disorder 12/17/2013   GERD (gastroesophageal reflux disease)    Primary localized osteoarthritis of right knee 11/06/2016   PTSD (post-traumatic stress disorder)      Past Surgical History:  Procedure Laterality Date   HERNIA REPAIR  1990's   KNEE SURGERY Right 1970's, 90's, 2011    ORIF FINGER / THUMB FRACTURE     SHOULDER SURGERY  2014   TONSILLECTOMY  1970's   TOTAL KNEE ARTHROPLASTY Right 11/06/2016   Procedure: TOTAL KNEE ARTHROPLASTY, RIGHT;  Surgeon: Teryl Lucy, MD;  Location: [facility] OR;  Service: Orthopedics;  Laterality: Right;   WRIST SURGERY  1990's     Allergies  Allergen Reactions   Wellbutrin [Bupropion] Photosensitivity and Anxiety    Current Outpatient Medications  Medication Instructions   atorvastatin (LIPITOR) 10 MG tablet 1 tablet, Oral, Daily-1800   clonazePAM (KLONOPIN) 1 MG tablet TAKE 1/2 TABLET BY MOUTH TWICE A DAY AND TAKE ONE TABLET BY MOUTH DAILY AT 4PM AND TAKE 1/2 TABLET BY MOUTH EVERY NIGHT AT BEDTIME   cyanocobalamin (VITAMIN B12) 500 mcg, Oral, Daily   finasteride (PROSCAR) 5 MG tablet No dose, route, or frequency recorded.   FLUoxetine (PROZAC) 40 MG capsule TAKE TWO CAPSULES BY MOUTH DAILY   gabapentin (NEURONTIN) 400 MG capsule TAKE TWO CAPSULES BY MOUTH THREE TIMES A DAY   L-methylfolate Calcium 15 mg, Oral, Daily   lamoTRIgine (LAMICTAL) 200 mg, Oral, Daily at bedtime   methylphenidate (RITALIN) 20 mg, Oral, 3 times daily with meals   methylphenidate (RITALIN) 20 mg, Oral, 3 times daily with meals   methylphenidate (RITALIN) 20 mg, Oral, 3 times daily with meals   Omega-3 Fatty Acids (FISH OIL PO) 1 capsule, Oral, Daily   QUEtiapine (SEROQUEL) 800 mg, Oral, Daily at bedtime   zolpidem (AMBIEN) 10 MG tablet Take 1/2 to 1 tablet of ( 10 mg) by mouth at Bedtime as needed     VITALS:   Vitals:   04/18/22 1337  BP: 108/69  Pulse: 77  Resp: 18  SpO2: 95%  Weight: 221 lb (100.2 kg)  Height: 5\' 10"  (1.778 m)      08/04/2018    2:46 PM  Depression screen PHQ 2/9  Decreased Interest 0  Down, Depressed, Hopeless 1  PHQ - 2 Score 1    PHYSICAL EXAM   HEENT:  Normocephalic, atraumatic. The mucous membranes are moist. The superficial temporal arteries are without ropiness or tenderness. Cardiovascular: Regular rate and  rhythm. Lungs: Clear to auscultation bilaterally. Neck: There are no carotid bruits noted bilaterally.  NEUROLOGICAL:    04/18/2022    2:00 PM  Montreal Cognitive Assessment   Visuospatial/ Executive (0/5) 4  Naming (0/3) 3  Attention: Read list of digits (0/2) 2  Attention: Read list of letters (0/1) 1  Attention: Serial 7 subtraction starting at 100 (0/3) 3  Language: Repeat phrase (0/2) 2  Language : Fluency (0/1) 1  Abstraction (0/2) 2  Delayed Recall (0/5) 5  Orientation (0/6) 6  Total 29  Adjusted Score (based on education) 29        No data to display           Orientation:  Alert and oriented to  person, place and time.  Anxious appearing no aphasia or dysarthria. Fund of knowledge is appropriate. Recent and remote memory intact.  Attention and concentration are normal.  Able to name objects and repeat phrases. Delayed recall 5/5  cranial nerves: There is good facial symmetry. Extraocular muscles are intact and visual fields are full to confrontational testing. Speech is fluent and clear. Soft palate rises symmetrically and there is no tongue deviation. Hearing is intact to conversational tone. Tone: Tone is good throughout. Tremors: Very minimal left greater than right intention tremor without any other parkinsonian signs.  No fasciculations.   Sensation: Sensation is intact to light touch and pinprick throughout. Vibration is intact at the bilateral big toe.There is no extinction with double simultaneous stimulation. There is no sensory dermatomal level identified. Coordination: The patient has no difficulty with RAM's or FNF bilaterally. Normal finger to nose  Motor: Strength is 5/5 in the bilateral upper and lower extremities. There is no pronator drift. There are no fasciculations noted. DTR's: Deep tendon reflexes are 2/4 at the bilateral biceps, triceps, brachioradialis, patella and achilles.  Plantar responses are downgoing bilaterally. Gait and Station: The patient  is able to ambulate without difficulty.The patient is able to heel toe walk without any difficulty.The patient is able to ambulate in a tandem fashion. The patient is able to stand in the Romberg position.     Thank you for allowing [place] the opportunity to participate in the care of this nice patient. Please do not hesitate to contact us for any questions or concerns.   Total time spent on today's visit was 45 minutes dedicated to this patient today, preparing to see patient, examining the patient, ordering tests and/or medications and counseling the patient, documenting clinical information in the EHR or other health record, independently interpreting results and communicating results to the patient/family, discussing treatment and goals, answering patient's questions and coordinating care.  Cc:  Arlina Robes, MD  Marlowe Kays 04/18/2022 2:32 PM

## 2022-04-18 NOTE — Patient Instructions (Addendum)
It was a pleasure to see you today at our office.   Recommendations:  Neurocognitive evaluation at our office MRI of the brain, the radiology office will call you to arrange you appointment Check labs today  Follow up in 1 month   Whom to call:  Memory  decline, memory medications: Call our office [phone]   For psychiatric meds, mood meds: Please have your primary care physician manage these medications.   Counseling regarding caregiver distress, including caregiver depression, anxiety and issues regarding community resources, adult day care programs, adult living facilities, or memory care questions:   Feel free to contact Misty Lisabeth Register, Social Worker at [phone]   For assessment of decision of mental capacity and competency:  Call Dr. Erick Blinks, geriatric psychiatrist at [phone]  For guidance in geriatric dementia issues please call [facility] [phone]  For guidance regarding [facility] and if placement were needed at the facility, contact Sidney Ace, Social Worker tel: [phone]  If you have any severe symptoms of a stroke, or other severe issues such as confusion,severe chills or fever, etc call 911 or go to the ER as you may need to be evaluated further      RECOMMENDATIONS FOR ALL PATIENTS WITH MEMORY PROBLEMS: 1. Continue to exercise (Recommend 30 minutes of walking everyday, or 3 hours every week) 2. Increase social interactions - continue going to [place] and enjoy social gatherings with friends and family 3. Eat healthy, avoid fried foods and eat more fruits and vegetables 4. Maintain adequate blood pressure, blood sugar, and blood cholesterol level. Reducing the risk of stroke and cardiovascular disease also helps promoting better memory. 5. Avoid stressful situations. Live a simple life and avoid aggravations. Organize your time and prepare for the next day in anticipation. 6. Sleep well, avoid any  interruptions of sleep and avoid any distractions in the bedroom that may interfere with adequate sleep quality 7. Avoid sugar, avoid sweets as there is a strong link between excessive sugar intake, diabetes, and cognitive impairment We discussed the Mediterranean diet, which has been shown to help patients reduce the risk of progressive memory disorders and reduces cardiovascular risk. This includes eating fish, eat fruits and green leafy vegetables, nuts like almonds and hazelnuts, walnuts, and also use olive oil. Avoid fast foods and fried foods as much as possible. Avoid sweets and sugar as sugar use has been linked to worsening of memory function.  There is always a concern of gradual progression of memory problems. If this is the case, then we may need to adjust level of care according to patient needs. Support, both to the patient and caregiver, should then be put into place.      You have been referred for a neuropsychological evaluation (i.e., evaluation of memory and thinking abilities). Please bring someone with you to this appointment if possible, as it is helpful for the doctor to hear from both you and another adult who knows you well. Please bring eyeglasses and hearing aids if you wear them.    The evaluation will take approximately 3 hours and has two parts:   The first part is a clinical interview with the neuropsychologist (Dr. Milbert Coulter or Dr. Roseanne Reno). During the interview, the neuropsychologist will speak with you and the individual you brought to the appointment.    The second part of the evaluation is testing with the doctor's technician Annabelle Harman or Selena Batten). During the testing, the technician will ask you to remember different types of  material, solve problems, and answer some questionnaires. Your family member will not be present for this portion of the evaluation.   Please note: We must reserve several hours of the neuropsychologist's time and the psychometrician's time for your  evaluation appointment. As such, there is a No-Show fee of $100. If you are unable to attend any of your appointments, please contact our office as soon as possible to reschedule.    FALL PRECAUTIONS: Be cautious when walking. Scan the area for obstacles that may increase the risk of trips and falls. When getting up in the mornings, sit up at the edge of the bed for a few minutes before getting out of bed. Consider elevating the bed at the head end to avoid drop of blood pressure when getting up. Walk always in a well-lit room (use night lights in the walls). Avoid area rugs or power cords from appliances in the middle of the walkways. Use a walker or a cane if necessary and consider physical therapy for balance exercise. Get your eyesight checked regularly.  FINANCIAL OVERSIGHT: Supervision, especially oversight when making financial decisions or transactions is also recommended.  HOME SAFETY: Consider the safety of the kitchen when operating appliances like stoves, microwave oven, and blender. Consider having supervision and share cooking responsibilities until no longer able to participate in those. Accidents with firearms and other hazards in the house should be identified and addressed as well.   ABILITY TO BE LEFT ALONE: If patient is unable to contact 911 operator, consider using LifeLine, or when the need is there, arrange for someone to stay with patients. Smoking is a fire hazard, consider supervision or cessation. Risk of wandering should be assessed by caregiver and if detected at any point, supervision and safe proof recommendations should be instituted.  MEDICATION SUPERVISION: Inability to self-administer medication needs to be constantly addressed. Implement a mechanism to ensure safe administration of the medications.   DRIVING: Regarding driving, in patients with progressive memory problems, driving will be impaired. We advise to have someone else do the driving if trouble finding  directions or if minor accidents are reported. Independent driving assessment is available to determine safety of driving.   If you are interested in the driving assessment, you can contact the following:  [facility] [employer] [facility] in [place] [phone]  [facility] [phone]  [facility] [phone] or [phone]    Mediterranean Diet A Mediterranean diet refers to food and lifestyle choices that are based on the traditions of countries located on the [employer]. This way of eating has been shown to help prevent certain conditions and improve outcomes for people who have chronic diseases, like kidney disease and heart disease. What are tips for following this plan? Lifestyle  Cook and eat meals together with your family, when possible. Drink enough fluid to keep your urine clear or pale yellow. Be physically active every day. This includes: Aerobic exercise like running or swimming. Leisure activities like gardening, walking, or housework. Get 7-8 hours of sleep each night. If recommended by your health care provider, drink red wine in moderation. This means 1 glass a day for nonpregnant women and 2 glasses a day for men. A glass of wine equals 5 oz (150 mL). Reading food labels  Check the serving size of packaged foods. For foods such as rice and pasta, the serving size refers to the amount of cooked product, not dry. Check the total fat in packaged foods. Avoid foods that  have saturated fat or trans fats. Check the ingredients list for added sugars, such as corn syrup. Shopping  At the grocery store, buy most of your food from the areas near the walls of the store. This includes: Fresh fruits and vegetables (produce). Grains, beans, nuts, and seeds. Some of these may be available in unpackaged forms or large amounts (in bulk). Fresh seafood. Poultry and eggs. Low-fat dairy products. Buy whole  ingredients instead of prepackaged foods. Buy fresh fruits and vegetables in-season from local farmers markets. Buy frozen fruits and vegetables in resealable bags. If you do not have access to quality fresh seafood, buy precooked frozen shrimp or canned fish, such as tuna, salmon, or sardines. Buy small amounts of raw or cooked vegetables, salads, or olives from the deli or salad bar at your store. Stock your pantry so you always have certain foods on hand, such as olive oil, canned tuna, canned tomatoes, rice, pasta, and beans. Cooking  Cook foods with extra-virgin olive oil instead of using butter or other vegetable oils. Have meat as a side dish, and have vegetables or grains as your main dish. This means having meat in small portions or adding small amounts of meat to foods like pasta or stew. Use beans or vegetables instead of meat in common dishes like chili or lasagna. Experiment with different cooking methods. Try roasting or broiling vegetables instead of steaming or sauteing them. Add frozen vegetables to soups, stews, pasta, or rice. Add nuts or seeds for added healthy fat at each meal. You can add these to yogurt, salads, or vegetable dishes. Marinate fish or vegetables using olive oil, lemon juice, garlic, and fresh herbs. Meal planning  Plan to eat 1 vegetarian meal one day each week. Try to work up to 2 vegetarian meals, if possible. Eat seafood 2 or more times a week. Have healthy snacks readily available, such as: Vegetable sticks with hummus. Greek yogurt. Fruit and nut trail mix. Eat balanced meals throughout the week. This includes: Fruit: 2-3 servings a day Vegetables: 4-5 servings a day Low-fat dairy: 2 servings a day Fish, poultry, or lean meat: 1 serving a day Beans and legumes: 2 or more servings a week Nuts and seeds: 1-2 servings a day Whole grains: 6-8 servings a day Extra-virgin olive oil: 3-4 servings a day Limit red meat and sweets to only a few servings  a month What are my food choices? Mediterranean diet Recommended Grains: Whole-grain pasta. Brown rice. Bulgar wheat. Polenta. Couscous. Whole-wheat bread. Orpah Cobb. Vegetables: Artichokes. Beets. Broccoli. Cabbage. Carrots. Eggplant. Green beans. Chard. Kale. Spinach. Onions. Leeks. Peas. Squash. Tomatoes. Peppers. Radishes. Fruits: Apples. Apricots. Avocado. Berries. Bananas. Cherries. Dates. Figs. Grapes. Lemons. Melon. Oranges. Peaches. Plums. Pomegranate. Meats and other protein foods: Beans. Almonds. Sunflower seeds. Pine nuts. Peanuts. Cod. Salmon. Scallops. Shrimp. Tuna. Tilapia. Clams. Oysters. Eggs. Dairy: Low-fat milk. Cheese. Greek yogurt. Beverages: Water. Red wine. Herbal tea. Fats and oils: Extra virgin olive oil. Avocado oil. Grape seed oil. Sweets and desserts: [place] yogurt with honey. Baked apples. Poached pears. Trail mix. Seasoning and other foods: Basil. Cilantro. Coriander. Cumin. Mint. Parsley. Sage. Rosemary. Tarragon. Garlic. Oregano. Thyme. Pepper. Balsalmic vinegar. Tahini. Hummus. Tomato sauce. Olives. Mushrooms. Limit these Grains: Prepackaged pasta or rice dishes. Prepackaged cereal with added sugar. Vegetables: Deep fried potatoes (french fries). Fruits: Fruit canned in syrup. Meats and other protein foods: Beef. Pork. Lamb. Poultry with skin. Hot dogs. Tomasa Blase. Dairy: Ice cream. Sour cream. Whole milk. Beverages: Juice. Sugar-sweetened soft drinks. Beer.  Liquor and spirits. Fats and oils: Butter. Canola oil. Vegetable oil. Beef fat (tallow). Lard. Sweets and desserts: Cookies. Cakes. Pies. Candy. Seasoning and other foods: Mayonnaise. Premade sauces and marinades. The items listed may not be a complete list. Talk with your dietitian about what dietary choices are right for you. Summary The Mediterranean diet includes both food and lifestyle choices. Eat a variety of fresh fruits and vegetables, beans, nuts, seeds, and whole grains. Limit the amount of  red meat and sweets that you eat. Talk with your health care provider about whether it is safe for you to drink red wine in moderation. This means 1 glass a day for nonpregnant women and 2 glasses a day for men. A glass of wine equals 5 oz (150 mL). This information is not intended to replace advice given to you by your health care provider. Make sure you discuss any questions you have with your health care provider. Document Released: 04/05/2016 Document Revised: 05/08/2016 Document Reviewed: 04/05/2016 Elsevier Interactive Patient Education  2017 [employer].   We have sent a referral to [facility] for your MRI and they will call you directly to schedule your appointment. They are located at [address] [facility]. If you need to contact them directly please call [phone].  Your provider has requested that you have labwork completed today. Please go to [facility] ([id number]) on the second floor of this building before leaving the office today. You do not need to check in. If you are not called within 15 minutes please check with the front desk.

## 2022-04-19 NOTE — Progress Notes (Signed)
Thyroid levels are normal.  Thank you

## 2022-04-20 ENCOUNTER — Other Ambulatory Visit: Payer: Self-pay

## 2022-04-20 ENCOUNTER — Telehealth: Payer: Self-pay | Admitting: Psychiatry

## 2022-04-20 ENCOUNTER — Ambulatory Visit: Payer: Medicare Other | Admitting: Psychiatry

## 2022-04-20 DIAGNOSIS — F900 Attention-deficit hyperactivity disorder, predominantly inattentive type: Secondary | ICD-10-CM

## 2022-04-20 MED ORDER — METHYLPHENIDATE HCL 20 MG PO TABS
20.0000 mg | ORAL_TABLET | Freq: Three times a day (TID) | ORAL | 0 refills | Status: DC
Start: 2022-04-20 — End: 2022-05-30

## 2022-04-20 NOTE — Telephone Encounter (Signed)
Pended.

## 2022-04-20 NOTE — Telephone Encounter (Signed)
Patient lvm requesting a refill on the Ritalin 20 mg.  Fill at the [facility] [address] [place] - [address]  [address] [place] [address]  Phone:  [phone]  Fax:  [phone]   Patient last seen 12/04/21, follow up scheduled for 06/04/22  Contact information # [phone]

## 2022-04-25 NOTE — Telephone Encounter (Signed)
Pt lvm that the [facility] doesn't have medicine in stock. He said the the [facility] [address] in [place] does have it in stock. Please cancel and re submit

## 2022-05-03 ENCOUNTER — Telehealth: Payer: Self-pay | Admitting: Psychiatry

## 2022-05-03 NOTE — Telephone Encounter (Signed)
Please advise 

## 2022-05-03 NOTE — Telephone Encounter (Signed)
Next visit is 05/30/22. Mark Hull called and said that [facility] in [place] is short supply of Ritalin. He states that he has been checking if its in stock and [facility] keeps telling him that it still has not come in and they are in short supply. Mark Hull wants to know if another medication can be substituted for the Ritalin? He is out of his Ritalin. Phone number is [phone]. Pharmacy is:  [facility] [address] [place] - [address]  Phone:  [phone]  Fax:  [phone]

## 2022-05-04 NOTE — Telephone Encounter (Signed)
He needs get name of another pharmacy besides [facility].  We've not had much trouble getting Ritalin.  I don't have an alternative to substitute

## 2022-05-07 ENCOUNTER — Ambulatory Visit
Admission: RE | Admit: 2022-05-07 | Discharge: 2022-05-07 | Disposition: A | Discharge status: Home / Self Care | Payer: Medicare Other | Source: Ambulatory Visit | Attending: Physician Assistant | Admitting: Physician Assistant

## 2022-05-07 NOTE — Telephone Encounter (Signed)
LVM

## 2022-05-07 NOTE — Telephone Encounter (Signed)
PT called back and said that [facility] located on [address] in [place] has it in stock

## 2022-05-08 ENCOUNTER — Other Ambulatory Visit: Payer: Self-pay

## 2022-05-08 DIAGNOSIS — F900 Attention-deficit hyperactivity disorder, predominantly inattentive type: Secondary | ICD-10-CM

## 2022-05-08 MED ORDER — METHYLPHENIDATE HCL 20 MG PO TABS: 20.0000 mg | ORAL_TABLET | Freq: Three times a day (TID) | ORAL | 0 refills | Status: DC

## 2022-05-08 NOTE — Telephone Encounter (Signed)
Pended.

## 2022-05-09 NOTE — Telephone Encounter (Signed)
Patient lvm at 11:30 stating that he was able to get his prescription.

## 2022-05-21 ENCOUNTER — Ambulatory Visit (INDEPENDENT_AMBULATORY_CARE_PROVIDER_SITE_OTHER): Payer: Medicare Other | Admitting: Psychiatry

## 2022-05-21 DIAGNOSIS — Z636 Dependent relative needing care at home: Secondary | ICD-10-CM | POA: Diagnosis not present

## 2022-05-21 DIAGNOSIS — F3130 Bipolar disorder, current episode depressed, mild or moderate severity, unspecified: Secondary | ICD-10-CM

## 2022-05-21 DIAGNOSIS — Z8659 Personal history of other mental and behavioral disorders: Secondary | ICD-10-CM | POA: Diagnosis not present

## 2022-05-21 DIAGNOSIS — F341 Dysthymic disorder: Secondary | ICD-10-CM | POA: Diagnosis not present

## 2022-05-21 NOTE — Progress Notes (Signed)
Psychotherapy Progress Note [facility] [name], PhD LP  Patient ID: Mark Hull [facility])    MRN: [id number] Therapy format: Individual psychotherapy Date: 05/21/2022      Start: 10:14a     Stop: 11:02a     Time Spent: 48 min Location: In-person   Session narrative (presenting needs, interim history, self-report of stressors and symptoms, applications of prior therapy, status changes, and interventions made in session) Has figured out he needs q 6 wks contact, as he has started back to feeling more like dying than living.  No frank SI, but some resurgence of intrusive thoughts of many years about dying and it being relief.  [name] has continued working, but only half the business she used to have.  She did get a stretch of improvement with neuro meds, now showing more tremor again, but not balking at treatment any more, after a long, hard won willingness to be diagnosed and treated for Parkinson's.  Amazed still he lived past 20, when he thought he never would, and now on the upslope to 69.  Had his neurology visit last month, administered the MoCA, scored 29/30, and had MRI, which produced no concerning results.  Normalized cognitive symptoms he has perceived as overworry/overconcern, mostly, perhaps his longstanding sense of fragility or a form of empathy for [facility], who may fairly be expected to decline herself with her illness.  Affirmed he certainly seems to be functioning normally in conversation, mostly relieved.    Been worrying over building out the ground floor at the house, for convenience to Cathy's deteriorating condition.  Says they only have $30K to work with, can't get a Proofreader for that, and he can't manage the whole thing project by himself.  Discussed assets and resources that may be available.  He has resources, including many relevant tools, and building and reno experience to where he could be part of the labor or manage the project if he can hire  economically enough.  Figures he could probably afford the materials for his bankroll if he could manage the labor without cost.  Noted [employer] as one possible building team willing.  Has already thought of a local [employer] that builds with donated labor.  Noted also that he may see fit to take out a building loan and pay off on time, if the only concern is savings on hand.  Also thinks he may be able to offer some of his tools in payment.  Therapeutic modalities: Cognitive Behavioral Therapy, Solution-Oriented/Positive Psychology, and Ego-Supportive  Mental Status/Observations:  Appearance:   Casual     Behavior:  Appropriate  Motor:  Normal  Speech/Language:   Clear and Coherent  Affect:  Appropriate  Mood:  depressed  Thought process:  Some obsession, responsive to redirection and challenge  Thought content:    Obsessions  Sensory/Perceptual disturbances:    WNL  Orientation:  Fully oriented  Attention:  Good    Concentration:  Good  Memory:  WNL  Insight:    Fair  Judgment:   Good  Impulse Control:  Good   Risk Assessment: Danger to Self: No Self-injurious Behavior: No Danger to Others: No Physical Aggression / Violence: No Duty to Warn: No Access to Firearms a concern: No  Assessment of progress:  stabilized  Diagnosis:   ICD-10-CM   1. Bipolar I disorder, most recent episode depressed (HCC)  F31.30     2. Severe early onset dysthymic disorder, in partial remission, with anxious distress,  with intermittent major depressive episodes, with current episode  F34.1     3. Caregiver stress  Z63.6     4. History of posttraumatic stress disorder (PTSD)  Z86.59      Plan:  Check into building charity and financing options for home modification Maintain safety and pledge to live.  Self-affirm reasons to live Self-affirm doing good work by [employer] and more to come to their life together Maintain social supports through church and other available  Financial trader best health habits including activity nutrition, and neuroprotective supplements Other recommendations/advice as may be noted above Continue to utilize previously learned skills ad lib Maintain medication as prescribed and work faithfully with relevant prescriber(s) if any changes are desired or seem indicated Call the clinic on-call service, 988/hotline, 911, or present to [facility] or ER if any life-threatening psychiatric crisis Return for time as available, recommend scheduling ahead. Already scheduled visit in this office 05/30/2022.  Blanchie Serve, PhD [name], PhD LP Clinical Psychologist, [facility] [address] [place], [place] [address] [phone]

## 2022-05-23 ENCOUNTER — Other Ambulatory Visit: Payer: Self-pay

## 2022-05-23 DIAGNOSIS — F3130 Bipolar disorder, current episode depressed, mild or moderate severity, unspecified: Secondary | ICD-10-CM

## 2022-05-23 DIAGNOSIS — F4010 Social phobia, unspecified: Secondary | ICD-10-CM

## 2022-05-23 MED ORDER — FLUOXETINE HCL 40 MG PO CAPS: 80.0000 mg | ORAL_CAPSULE | Freq: Every day | ORAL | 3 refills | Status: DC

## 2022-05-28 ENCOUNTER — Encounter: Payer: Self-pay | Admitting: Physician Assistant

## 2022-05-28 ENCOUNTER — Ambulatory Visit: Payer: Medicare Other | Admitting: Physician Assistant

## 2022-05-28 DIAGNOSIS — Z029 Encounter for administrative examinations, unspecified: Secondary | ICD-10-CM

## 2022-05-30 ENCOUNTER — Encounter: Payer: Self-pay | Admitting: Psychiatry

## 2022-05-30 ENCOUNTER — Telehealth: Payer: Self-pay | Admitting: Psychiatry

## 2022-05-30 ENCOUNTER — Ambulatory Visit (INDEPENDENT_AMBULATORY_CARE_PROVIDER_SITE_OTHER): Payer: Medicare Other | Admitting: Psychiatry

## 2022-05-30 VITALS — BP 128/76 | HR 66

## 2022-05-30 DIAGNOSIS — F900 Attention-deficit hyperactivity disorder, predominantly inattentive type: Secondary | ICD-10-CM | POA: Diagnosis not present

## 2022-05-30 DIAGNOSIS — F4010 Social phobia, unspecified: Secondary | ICD-10-CM

## 2022-05-30 DIAGNOSIS — F3130 Bipolar disorder, current episode depressed, mild or moderate severity, unspecified: Secondary | ICD-10-CM

## 2022-05-30 DIAGNOSIS — Z8659 Personal history of other mental and behavioral disorders: Secondary | ICD-10-CM

## 2022-05-30 MED ORDER — METHYLPHENIDATE HCL 20 MG PO TABS
20.0000 mg | ORAL_TABLET | Freq: Three times a day (TID) | ORAL | 0 refills | Status: DC
Start: 2022-06-27 — End: 2022-08-30

## 2022-05-30 MED ORDER — GABAPENTIN 400 MG PO CAPS: ORAL_CAPSULE | ORAL | 1 refills | Status: DC

## 2022-05-30 MED ORDER — METHYLPHENIDATE HCL 20 MG PO TABS: 20.0000 mg | ORAL_TABLET | Freq: Three times a day (TID) | ORAL | 0 refills | Status: DC

## 2022-05-30 MED ORDER — LAMOTRIGINE 200 MG PO TABS: 200.0000 mg | ORAL_TABLET | Freq: Every day | ORAL | 1 refills | Status: DC

## 2022-05-30 NOTE — Patient Instructions (Signed)
(  NAC) N-Acetylcysteine 2 of the  600 mg capsules daily to help with mild cognitive problems.  It can be combined with a B-complex vitamin as the B-12 and folate which can sometimes enhance the effect. [email or web address]

## 2022-05-30 NOTE — Telephone Encounter (Signed)
Patient has been on medications long-term. Info provided to pharmacy.

## 2022-05-30 NOTE — Telephone Encounter (Signed)
Addressed with pharmacy

## 2022-05-30 NOTE — Telephone Encounter (Signed)
[facility] called and said Mark Hull is taking Fluoxetine 40 mg and has active prescriptions for Quetiapine and Clonazepam. Pharmacy said if they are taking together it may cause worsening depression. Just informing.    [place], [place]  Phone:  [phone]  Fax:  [phone]

## 2022-05-30 NOTE — Progress Notes (Signed)
[name] [id number] [birth year] 66 y.o.   Subjective:   Patient ID:  Mark Hull is a 66 y.o. (DOB Jul 08, 1956) male.  Chief Complaint:  Chief Complaint  Patient presents with   Follow-up    Bipolar I disorder, most recent episode depressed (HCC)   Depression   Anxiety   ADHD    Anxiety Symptoms include decreased concentration and nervous/anxious behavior. Patient reports no chest pain, confusion, palpitations or suicidal ideas.    Depression        Associated symptoms include decreased concentration and myalgias.  Associated symptoms include no suicidal ideas.  Past medical history includes anxiety.    Mark Hull presents to the office today for follow-up of mood anxiety.  seen November 2020.  No meds were changed.  03/2020 appointment with the following noted: No meds were changed Stress wife, Olegario Messier,  [place] PD from 2 doctors and she won't accept the dx or treatment and he feels stuck and anxious over it.  He's trying to move to one level house.   Also stress F died 03-11-2020 at 67 yo.   Tolerating meds. No desire to change meds.  12/08/2020 appointment with the following noted:  No SE wth meds and compliant without changes. Doing good overall.   1 month ago W CA under tongue and surgery at [facility].  Expected to be CA free now.  He had to care for her.   She won't accept dx PD and is slow. Not on meds for it. Disc this stress.   Accepted station in life and let things go and feels better than in a long time. Plan: No medicine changes today.  He feels fine with the meds but anxious in the afternoon more. Continue the following meds: Concerta 54 mg every morning Quetiapine 400 mg nightly Clonazepam 1/2 tablet twice a day and 1 tablet at 4 PM and 1/2 tablet as needed anxiety Fluoxetine 80 mg daily Lamotrigine 200 mg daily Gabapentin 800 mg 3 times daily  Helps pain and anxiety Mostly stopped Ambien 5 mg nightly as needed insomnia  06/05/21 appt noted: Doing well and  enjoying things.  Active and productive.  Being outside helps. No SE but some wordfinding problems. Doing very well overall.  Things improved with the Concerta added June 2019.  Level of depression is improved not gone.   Plan: No med changes  12/04/2021 appointment with the following noted: Has been through a period of anxiety since he was last year.  He wondered if the Ritalin was contributing but at the time decided to stay on it. Wife with PD and dx CA last year.  TKR and missed a lot of work last year and went through savings.  She is doing better now.   Mood ok other than anxious before noted but that's also better.Depression improved 4/10.  Anxiety manageable.  Sleep is good.  ritalin helps energy.  No significant manic symptoms since here.  No irritability or agitation.  Minimal racing thoughts except when anxious.  Satisfied with the meds. No SE Plan: no med changes  05/30/2022 appointment noted: No med changes Had trouble getting Ritalin with shortage but got it. Saw [facility] ref by PCP. MoCA today is 29/30, visuospatial 4/5, delayed recall 5/5.  Sched for neuropsych testing June 2024 May be age related memory concerns bc neuro didn't suspect serious memory concerns. Overall very well with mental health Not taking Ambien in  a year or 2.  Sleep  is good. No SE with meds. Still socially inactive and avoidant but doesn't see it as a problem.  Wife's PD is worse and not bothered by his psych issues.  She has some paranoia.      Past Psychiatric Medication Trials: Viibryd, Wellbutrin, venlafaxine, fluoxetine, paroxetine, sertraline Lithium, gabapentin, quetiapine, Latuda, Abilify, Vraylar side effects, lamotrigine Concerta, pramipexole, Nuvigil Clonazepam, Ambien  Review of Systems:  Review of Systems  Cardiovascular:  Negative for chest pain and palpitations.  Genitourinary:  Negative for difficulty urinating.  Musculoskeletal:  Positive for arthralgias  and myalgias.  Neurological:  Negative for tremors.  Psychiatric/Behavioral:  Positive for decreased concentration and dysphoric mood. Negative for agitation, behavioral problems, confusion, hallucinations, self-injury, sleep disturbance and suicidal ideas. The patient is nervous/anxious. The patient is not hyperactive.     Medications: I have reviewed the patient's current medications.  Current Outpatient Medications  Medication Sig Dispense Refill   atorvastatin (LIPITOR) 10 MG tablet Take 1 tablet by mouth daily at 6 PM.      clonazePAM (KLONOPIN) 1 MG tablet TAKE 1/2 TABLET BY MOUTH TWICE A DAY AND TAKE ONE TABLET BY MOUTH DAILY AT 4PM AND TAKE 1/2 TABLET BY MOUTH EVERY NIGHT AT BEDTIME 120 tablet 5   finasteride (PROSCAR) 5 MG tablet      FLUoxetine (PROZAC) 40 MG capsule Take 2 capsules (80 mg total) by mouth daily. 180 capsule 3   Omega-3 Fatty Acids (FISH OIL PO) Take 1 capsule by mouth daily.     QUEtiapine (SEROQUEL) 400 MG tablet Take 2 tablets (800 mg total) by mouth at bedtime. (Patient taking differently: Take 800 mg by mouth at bedtime. 1 tab HS) 180 tablet 3   vitamin B-12 (CYANOCOBALAMIN) 500 MCG tablet Take 1 tablet (500 mcg total) by mouth daily. 90 tablet 1   gabapentin (NEURONTIN) 400 MG capsule TAKE TWO CAPSULES BY MOUTH THREE TIMES A DAY 540 capsule 1   L-methylfolate Calcium 15 MG TABS Take 15 mg by mouth daily. (Patient not taking: Reported on 05/30/2022) 30 tablet 5   lamoTRIgine (LAMICTAL) 200 MG tablet Take 1 tablet (200 mg total) by mouth at bedtime. 90 tablet 1   [START ON 07/25/2022] methylphenidate (RITALIN) 20 MG tablet Take 1 tablet (20 mg total) by mouth 3 (three) times daily with meals. 90 tablet 0   [START ON 06/27/2022] methylphenidate (RITALIN) 20 MG tablet Take 1 tablet (20 mg total) by mouth 3 (three) times daily with meals. 90 tablet 0   methylphenidate (RITALIN) 20 MG tablet Take 1 tablet (20 mg total) by mouth 3 (three) times daily with meals. 90 tablet  0   No current facility-administered medications for this visit.    Medication Side Effects: None  Allergies:  Allergies  Allergen Reactions   Wellbutrin [Bupropion] Photosensitivity and Anxiety    Past Medical History:  Diagnosis Date   Anxiety    Arthritis    Bipolar disorder (HCC)    Depression    Gait disorder 12/17/2013   GERD (gastroesophageal reflux disease)    Primary localized osteoarthritis of right knee 11/06/2016   PTSD (post-traumatic stress disorder)     Family History  Problem Relation Age of Onset   Heart failure Father    Cancer Father    COPD Mother    Cancer Sister     Social History   Socioeconomic History   Marital status: Married    Spouse name: Not on file   Number of children: Not on file   Years  of education: 20   Highest education level: Not on file  Occupational History   Not on file  Tobacco Use   Smoking status: Never   Smokeless tobacco: Never  Substance and Sexual Activity   Alcohol use: No   Drug use: No   Sexual activity: Not on file  Other Topics Concern   Not on file  Social History Narrative   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   Please see review of systems for further details on the patient's review from today.   Objective:   Physical Exam:  BP 128/76   Pulse 66   Physical Exam Constitutional:      General: He is not in acute distress. Musculoskeletal:        General: No deformity.  Neurological:     Mental Status: He is alert and oriented to person, place, and time.     Cranial  Nerves: No dysarthria.     Coordination: Coordination normal.  Psychiatric:        Attention and Perception: Attention and perception normal. He does not perceive auditory or visual hallucinations.        Mood and Affect: Mood is anxious and depressed. Affect is not labile, blunt, angry or inappropriate.        Speech: Speech normal.        Behavior: Behavior normal. Behavior is cooperative.        Thought Content: Thought content normal. Thought content is not paranoid or delusional. Thought content does not include homicidal or suicidal ideation. Thought content does not include suicidal plan.        Cognition and Memory: Cognition normal. He does not exhibit impaired recent memory.        Judgment: Judgment normal.     Comments: Insight fair to good. Overall satisfied with meds depression and anxiety better with meds not gone STM limited to word-finding and not likely to be clinically sig     Lab Review:     Component Value Date/Time   NA 134 (L) 11/07/2016 0533   K 3.9 11/07/2016 0533   CL 99 (L) 11/07/2016 0533   CO2 26 11/07/2016 0533   GLUCOSE 125 (H) 11/07/2016 0533   BUN 6 11/07/2016 0533   CREATININE 0.99 11/07/2016 0533   CALCIUM 8.4 (L) 11/07/2016 0533   GFRNONAA >60 11/07/2016 0533   GFRAA >60 11/07/2016 0533       Component Value Date/Time   WBC 6.8 11/07/2016 0533   RBC 3.79 (L) 11/07/2016 0533   HGB 11.5 (L) 11/07/2016 0533   HCT 35.0 (L) 11/07/2016 0533   PLT 196 11/07/2016 0533   MCV 92.3 11/07/2016 0533   MCH 30.3 11/07/2016 0533   MCHC 32.9 11/07/2016 0533   RDW 12.9 11/07/2016 0533    No results found for: "POCLITH", "LITHIUM"   No results found for: "PHENYTOIN", "PHENOBARB", "VALPROATE", "CBMZ"   .res Assessment: Plan:    Bipolar I disorder, most recent episode depressed ([place]) - Plan: methylphenidate (RITALIN) 20 MG tablet, lamoTRIgine (LAMICTAL) 200  MG tablet  History of posttraumatic stress disorder (PTSD) - Plan: gabapentin (NEURONTIN)  400 MG capsule  Social anxiety disorder - Plan: gabapentin (NEURONTIN) 400 MG capsule  Attention deficit hyperactivity disorder (ADHD), predominantly inattentive type - Plan: methylphenidate (RITALIN) 20 MG tablet, methylphenidate (RITALIN) 20 MG tablet, methylphenidate (RITALIN) 20 MG tablet  Greater than 50% of face to face time with patient was spent on counseling and coordination of care. We discussed Patient has multiple psychiatric diagnoses as noted.  The last medicine change was the addition of Concerta in June 2019 which further reduced his depressive symptoms and improved his energy and productivity.  It is not ideal to be using that in combination with the benzodiazepines and he is aware of that and trying to minimize the amount of benzodiazepine.  He is tolerating his medications well.  He does not want medicine changes.  No medicine changes today.  He feels fine with the meds but anxious in the afternoon more. Continue the following meds: Rtialin 20 TID Quetiapine 400 mg nightly Clonazepam 1/2 tablet twice a day and 1 tablet at 4 PM and 1/2 tablet as needed anxiety Fluoxetine 80 mg daily Lamotrigine 200 mg daily Gabapentin 800 mg 3 times daily  Helps pain and anxiety  Discussed the polypharmacy again which is not ideal but has been helpful and he is tolerating it.  Discussed the risks associated.  Disc the off-label use of N-Acetylcysteine (NAC) at 600 mg capsule 1-2 daily to help with mild cognitive problems.  It can be combined with a B-complex vitamin (or specifically with B-12 and methylfolate)  have been shown to sometimes enhance the effect. Has not tried this yet. Taking methylfolate  We discussed the short-term risks associated with benzodiazepines including sedation and increased fall risk among others.  Discussed long-term side effect risk including dependence, potential withdrawal symptoms, and the potential eventual dose-related risk of dementia.  But recent studies  from 2020 dispute this association between benzodiazepines and dementia risk. Newer studies in 2020 do not support an association with dementia. Still needs to take some clonazepam daily about 2 mg  bc chronic anxiety. It still works.  As noted above the patient gets benefit from gabapentin.   We both feel that it is helpful and the benefit outweighs any risks.  Additionally I am quite confident in his anxiety at least as well as his joint pain would worsen markedly off of the medication.  Supportive therapy dealing with wife's dx PD and her refusal to accept the dx or any treatment.  Disc strategies to help get her engaged in treatment by reframing it as a tremor rather than as PD.  Disc this in detail.  Disc wife's worsening paranoia. And how to communicate about this to neuro.  Follow-up 6 months  Meredith Staggers MD, DFAPA  Please see After Visit Summary for patient specific instructions.  Future Appointments  Date Time Provider Department Center  07/09/2022  3:00 PM Robley Fries, PhD CP-CP None  08/13/2022 10:00 AM Robley Fries, PhD CP-CP None  09/17/2022 10:00 AM Robley Fries, PhD CP-CP None  02/04/2023  8:30 AM Rosann Auerbach, PhD [facility] None  02/04/2023  9:30 AM LBN- NEUROPSYCH TECH [facility] None  02/11/2023 10:00 AM Rosann Auerbach, PhD [facility] None    No orders of the defined types were placed in this encounter.     -------------------------------

## 2022-05-30 NOTE — Telephone Encounter (Signed)
[place] LVM @ 9:28a.  Regarding the new script for Fluoxetine for the pt, the pharmacy is advising that the pt also has an active script for Clonazepam 1mg  and Quetiapine 400mg  and when all 3 medicines are used, it can cause CNS(?), depression and sedation.  They want either a fax to [phone] or a call back to [phone] advising if it's ok.  Ref # [id number].  No appts scheduled.

## 2022-06-04 ENCOUNTER — Ambulatory Visit: Payer: Medicare Other | Admitting: Psychiatry

## 2022-07-09 ENCOUNTER — Ambulatory Visit: Payer: Medicare Other | Admitting: Psychiatry

## 2022-07-09 DIAGNOSIS — Z538 Procedure and treatment not carried out for other reasons: Secondary | ICD-10-CM

## 2022-07-09 NOTE — Progress Notes (Signed)
Admin note for non-service contact  Patient ID: CLEOTIS SPARR  MRN: [id number] DATE: 07/09/2022  Session CA by PT with TX running late.  No charge, RS as desired.  Left VM fr pt.  Robley Fries, PhD [name], PhD LP Clinical Psychologist, [facility] [address], [address] [place], [place] [address] [phone]

## 2022-08-11 ENCOUNTER — Other Ambulatory Visit: Payer: Self-pay | Admitting: Psychiatry

## 2022-08-11 DIAGNOSIS — Z8659 Personal history of other mental and behavioral disorders: Secondary | ICD-10-CM

## 2022-08-11 DIAGNOSIS — F4010 Social phobia, unspecified: Secondary | ICD-10-CM

## 2022-08-13 ENCOUNTER — Ambulatory Visit: Payer: Medicare Other | Admitting: Psychiatry

## 2022-08-29 ENCOUNTER — Telehealth: Payer: Self-pay | Admitting: Psychiatry

## 2022-08-29 NOTE — Telephone Encounter (Signed)
Last filled 12/7, due 1/4 

## 2022-08-29 NOTE — Telephone Encounter (Signed)
Pt lvm that he needs a refill on his ritalin 20 mg. [facility]acy [facility] in [place]

## 2022-08-30 ENCOUNTER — Telehealth: Payer: Self-pay

## 2022-08-30 DIAGNOSIS — F900 Attention-deficit hyperactivity disorder, predominantly inattentive type: Secondary | ICD-10-CM

## 2022-08-30 MED ORDER — METHYLPHENIDATE HCL 20 MG PO TABS: 20.0000 mg | ORAL_TABLET | Freq: Three times a day (TID) | ORAL | 0 refills | Status: DC

## 2022-08-30 NOTE — Telephone Encounter (Signed)
Pended.

## 2022-08-31 ENCOUNTER — Other Ambulatory Visit: Payer: Self-pay

## 2022-08-31 DIAGNOSIS — F900 Attention-deficit hyperactivity disorder, predominantly inattentive type: Secondary | ICD-10-CM

## 2022-08-31 MED ORDER — METHYLPHENIDATE HCL 20 MG PO TABS: 20.0000 mg | ORAL_TABLET | Freq: Three times a day (TID) | ORAL | 0 refills | Status: DC

## 2022-08-31 NOTE — Telephone Encounter (Signed)
Patient lvm at 11:45 stating that a prescription for Ritalin 20mg  was sent in to [facility]. Medication is on back order and will not be in stock until 1/26. He has found a different pharmacy and would like it sent to [place], [place] Ph: [place] 4/8

## 2022-08-31 NOTE — Telephone Encounter (Signed)
Pended.

## 2022-09-17 ENCOUNTER — Ambulatory Visit: Payer: Medicare Other | Admitting: Psychiatry

## 2022-10-18 ENCOUNTER — Ambulatory Visit: Payer: Medicare Other | Admitting: Physician Assistant

## 2022-10-18 ENCOUNTER — Encounter: Payer: Self-pay | Admitting: Physician Assistant

## 2022-10-18 DIAGNOSIS — Z029 Encounter for administrative examinations, unspecified: Secondary | ICD-10-CM

## 2022-10-29 ENCOUNTER — Ambulatory Visit (INDEPENDENT_AMBULATORY_CARE_PROVIDER_SITE_OTHER): Payer: Medicare Other | Admitting: Psychiatry

## 2022-10-29 DIAGNOSIS — Z8659 Personal history of other mental and behavioral disorders: Secondary | ICD-10-CM | POA: Diagnosis not present

## 2022-10-29 DIAGNOSIS — F4010 Social phobia, unspecified: Secondary | ICD-10-CM

## 2022-10-29 DIAGNOSIS — Z636 Dependent relative needing care at home: Secondary | ICD-10-CM

## 2022-10-29 DIAGNOSIS — F341 Dysthymic disorder: Secondary | ICD-10-CM | POA: Diagnosis not present

## 2022-10-29 DIAGNOSIS — F3130 Bipolar disorder, current episode depressed, mild or moderate severity, unspecified: Secondary | ICD-10-CM | POA: Diagnosis not present

## 2022-10-29 NOTE — Progress Notes (Signed)
Psychotherapy Progress Note [facility] [name], PhD LP  Patient ID: CLEARANCE CHENAULT [facility])    MRN: [id number] Therapy format: Individual psychotherapy Date: 10/29/2022      Start: 10:14a     Stop: 11:00a     Time Spent: 46 min Location: In-person   Session narrative (presenting needs, interim history, self-report of stressors and symptoms, applications of prior therapy, status changes, and interventions made in session) Here to check in, after several months away.  Rates mood about 6/10.  Medication -- Had a snafu with Ritalin supply in the fall, and went through a bad low, but in 1-2 weeks felt back to baseline, without it.  Still not back on it.  Affirmed simplifying and clarifying.  Cognitive concerns -- Was concerned he might be developing dementia himself, though we figured it was more likely stress-related.  Had neuropsych screening and MRI, found no structural issues.  Knows he is tolerant to Klonopin after 25 years, which could be dulling.  Last week ran out for 2 days due to an issue misplacing his bottle among Cathy's, not noticing, and not being able to find his "strategic reserve" of 225 pills.  Did go through shocky, anxious reactions until found.  Cautioned that the "reserve" may be out of date.  Cathy's health -- Had hip replacement, back to work last week.  Been going to PT 3/wk, re-eval coming soon.  Parkinson's is beginning to show more near bedtime, with restlessness and hypersensitivity to the bed, forgetting it's adjustable.  Has successfully relocated to a smaller home, country farmhouse style with an upstairs, and is having a Chief Strategy Officer add a walk-in closet and ADA bathroom off the bedroom, in the former footprint of a 20-ft deck.  She has afoot drag, also, aftermath of cancer surgery.  Resting tremor that does not interfere with hair cutting as currently medicated.    Activity -- Enjoying walking the dogs, getting movement in.  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    Sleep -- Starting to get disturbed by [facility] waking up between 2 and 4, wanting him to get up and tend to things.  She is also becoming more negative, saying she hates her sister, Jackelyn Poling, who betrayed her with inheritance.  Significant in that Kirk used to strongly repress any anger or objection toward sister, and it may signify either some freeing or some early dementia losing her usual filter.  Brainstormed ways to help Cathy sleep -- possible melatonin, possible magnesium supplement.  Spiritual -- Feeling some drag on his faith, would like to share a book in future sessions, Letters From a Skeptic, to help bette entrench his faith.  Agreed he may, if it would suit him well and help morale.  Therapeutic modalities: Cognitive Behavioral Therapy, Solution-Oriented/Positive Psychology, and Ego-Supportive  Mental Status/Observations:  Appearance:   Casual     Behavior:  Appropriate  Motor:  Normal  Speech/Language:   Clear and Coherent  Affect:  Appropriate  Mood:  dysthymic  Thought process:  normal  Thought content:    WNL  Sensory/Perceptual disturbances:    WNL  Orientation:  Fully oriented  Attention:  Good    Concentration:  Good  Memory:  WNL  Insight:    Fair  Judgment:   Good  Impulse Control:  Good   Risk Assessment: Danger to Self: No Self-injurious Behavior: No Danger to Others: No Physical Aggression / Violence: No Duty to Warn:  No Access to Firearms a concern: No  Assessment of progress:  progressing  Diagnosis:   ICD-10-CM   1. Bipolar I disorder, most recent episode depressed ([place])  F31.30     2. History of posttraumatic stress disorder (PTSD)  Z86.59     3. Social anxiety disorder  F40.10     4. Severe early onset dysthymic disorder, in partial remission, with anxious distress, with intermittent major depressive episodes, with current episode  F34.1     5. Caregiver  stress  Z63.6      Plan:  Maintain safety and pledge to live.  Self-affirm reasons to live Self-affirm doing good work by [employer] and more to come to their life together Maintain social supports through church and other available Financial trader best health habits including activity nutrition, and neuroprotective supplements like turmeric, omega 3, B complex, vitamin D Cognitive issues are about spoiled sleep and Klonopin SE until proven otherwise Worth remaining off stimulant.  Manage caffeine if needed. Longterm recommend weaning Klonopin Other recommendations/advice as may be noted above Continue to utilize previously learned skills ad lib Maintain medication as prescribed and work faithfully with relevant prescriber(s) if any changes are desired or seem indicated Call the clinic on-call service, 988/hotline, 911, or present to [facility] or ER if any life-threatening psychiatric crisis Return for time at discretion. Already scheduled visit in this office 12/03/2022.  Blanchie Serve, PhD [name], PhD LP Clinical Psychologist, [facility] [address] [place], [place] [address] [phone]

## 2022-11-06 ENCOUNTER — Other Ambulatory Visit: Payer: Self-pay | Admitting: Psychiatry

## 2022-11-06 ENCOUNTER — Telehealth: Payer: Self-pay | Admitting: Psychiatry

## 2022-11-06 DIAGNOSIS — F900 Attention-deficit hyperactivity disorder, predominantly inattentive type: Secondary | ICD-10-CM

## 2022-11-06 MED ORDER — METHYLPHENIDATE HCL 20 MG PO TABS: 20.0000 mg | ORAL_TABLET | Freq: Three times a day (TID) | ORAL | 0 refills | Status: DC

## 2022-11-06 NOTE — Telephone Encounter (Signed)
Pt called requesting Rx for  generic Ritalin 20 mg 3/d to [facility] [place]   Apt 4/8

## 2022-11-26 ENCOUNTER — Other Ambulatory Visit: Payer: Self-pay | Admitting: Psychiatry

## 2022-11-26 DIAGNOSIS — F3130 Bipolar disorder, current episode depressed, mild or moderate severity, unspecified: Secondary | ICD-10-CM

## 2022-12-03 ENCOUNTER — Ambulatory Visit (INDEPENDENT_AMBULATORY_CARE_PROVIDER_SITE_OTHER): Payer: Medicare Other | Admitting: Psychiatry

## 2022-12-03 ENCOUNTER — Encounter: Payer: Self-pay | Admitting: Psychiatry

## 2022-12-03 DIAGNOSIS — F3130 Bipolar disorder, current episode depressed, mild or moderate severity, unspecified: Secondary | ICD-10-CM

## 2022-12-03 DIAGNOSIS — F900 Attention-deficit hyperactivity disorder, predominantly inattentive type: Secondary | ICD-10-CM

## 2022-12-03 DIAGNOSIS — F4010 Social phobia, unspecified: Secondary | ICD-10-CM | POA: Diagnosis not present

## 2022-12-03 DIAGNOSIS — Z8659 Personal history of other mental and behavioral disorders: Secondary | ICD-10-CM | POA: Diagnosis not present

## 2022-12-03 MED ORDER — METHYLPHENIDATE HCL 20 MG PO TABS: 20.0000 mg | ORAL_TABLET | Freq: Three times a day (TID) | ORAL | 0 refills | Status: DC

## 2022-12-03 NOTE — Progress Notes (Signed)
Mark Hull [id number] 06/03/1956 67 y.o.   Subjective:   Patient ID:  Mark Hull is a 67 y.o. (DOB [birth year]) male.  Chief Complaint:  Chief Complaint  Patient presents with   Follow-up   Depression   Anxiety   ADD    Anxiety Symptoms include decreased concentration and nervous/anxious behavior. Patient reports no confusion, palpitations or suicidal ideas.    Depression        Associated symptoms include decreased concentration and myalgias.  Associated symptoms include no suicidal ideas.  Past medical history includes anxiety.    Mark Hull presents to the office today for follow-up of mood anxiety.  seen November 2020.  No meds were changed.  03/2020 appointment with the following noted: No meds were changed Stress wife, Olegario Messier,  [place] PD from 2 doctors and she won't accept the dx or treatment and he feels stuck and anxious over it.  He's trying to move to one level house.   Also stress F died 03-05-20 at 69 yo.   Tolerating meds. No desire to change meds.  12/08/2020 appointment with the following noted:  No SE wth meds and compliant without changes. Doing good overall.   1 month ago W CA under tongue and surgery at [facility].  Expected to be CA free now.  He had to care for her.   She won't accept dx PD and is slow. Not on meds for it. Disc this stress.   Accepted station in life and let things go and feels better than in a long time. Plan: No medicine changes today.  He feels fine with the meds but anxious in the afternoon more. Continue the following meds: Concerta 54 mg every morning Quetiapine 400 mg nightly Clonazepam 1/2 tablet twice a day and 1 tablet at 4 PM and 1/2 tablet as needed anxiety Fluoxetine 80 mg daily Lamotrigine 200 mg daily Gabapentin 800 mg 3 times daily  Helps pain and anxiety Mostly stopped Ambien 5 mg nightly as needed insomnia  06/05/21 appt noted: Doing well and enjoying things.  Active and productive.  Being outside helps. No SE but  some wordfinding problems. Doing very well overall.  Things improved with the Concerta added June 2019.  Level of depression is improved not gone.   Plan: No med changes  12/04/2021 appointment with the following noted: Has been through a period of anxiety since he was last year.  He wondered if the Ritalin was contributing but at the time decided to stay on it. Wife with PD and dx CA last year.  TKR and missed a lot of work last year and went through savings.  She is doing better now.   Mood ok other than anxious before noted but that's also better.Depression improved 4/10.  Anxiety manageable.  Sleep is good.  ritalin helps energy.  No significant manic symptoms since here.  No irritability or agitation.  Minimal racing thoughts except when anxious.  Satisfied with the meds. No SE Plan: no med changes  05/30/2022 appointment noted: No med changes Had trouble getting Ritalin with shortage but got it. Saw Marlowe Kays [facility] [place] [place] [facility] ref by PCP. MoCA today is 29/30, visuospatial 4/5, delayed recall 5/5.  Sched for neuropsych testing June 2024 May be age related memory concerns bc neuro didn't suspect serious memory concerns. Overall very well with mental health Not taking Ambien in  a year or 2.  Sleep is good. No SE with meds. Still socially inactive and avoidant but doesn't  see it as a problem.  Wife's PD is worse and not bothered by his psych issues.  She has some paranoia.   Plan: no changes  12/03/22 appt: No med changes.  Having hard time getting Ritalin.  At one point out for a month. No other med concerns or SE. Dep 4/10 but worse off Ritalin for a few days.  It helps depressiona dn focus. No other concerns.   Past Psychiatric Medication Trials: Viibryd, Wellbutrin, venlafaxine, fluoxetine, paroxetine, sertraline Lithium, gabapentin, quetiapine, Latuda, Abilify, Vraylar side effects, lamotrigine Concerta, pramipexole, Nuvigil Clonazepam, Ambien  Review of Systems:  Review  of Systems  Cardiovascular:  Negative for palpitations.  Genitourinary:  Negative for difficulty urinating.  Musculoskeletal:  Positive for arthralgias and myalgias.  Neurological:  Negative for tremors.  Psychiatric/Behavioral:  Positive for decreased concentration and dysphoric mood. Negative for agitation, behavioral problems, confusion, hallucinations, self-injury, sleep disturbance and suicidal ideas. The patient is nervous/anxious. The patient is not hyperactive.     Medications: I have reviewed the patient's current medications.  Current Outpatient Medications  Medication Sig Dispense Refill   atorvastatin (LIPITOR) 10 MG tablet Take 1 tablet by mouth daily at 6 PM.      clonazePAM (KLONOPIN) 1 MG tablet TAKE 1/2 TABLET TWICE A DAY AS DIRECTED, TAKE 1 TABLET DAILY AT 4PM AND TAKE 1/2 TABLET EVERY NIGHT AT BEDTIME 75 tablet 5   finasteride (PROSCAR) 5 MG tablet      FLUoxetine (PROZAC) 40 MG capsule Take 2 capsules (80 mg total) by mouth daily. 180 capsule 3   gabapentin (NEURONTIN) 400 MG capsule TAKE TWO CAPSULES BY MOUTH THREE TIMES A DAY 540 capsule 1   L-methylfolate Calcium 15 MG TABS Take 15 mg by mouth daily. 30 tablet 5   lamoTRIgine (LAMICTAL) 200 MG tablet TAKE 1 TABLET AT BEDTIME 90 tablet 3   Omega-3 Fatty Acids (FISH OIL PO) Take 1 capsule by mouth daily.     QUEtiapine (SEROQUEL) 400 MG tablet TAKE 2 TABLETS AT BEDTIME 180 tablet 3   vitamin B-12 (CYANOCOBALAMIN) 500 MCG tablet Take 1 tablet (500 mcg total) by mouth daily. 90 tablet 1   methylphenidate (RITALIN) 20 MG tablet Take 1 tablet (20 mg total) by mouth 3 (three) times daily with meals. 90 tablet 0   [START ON 12/31/2022] methylphenidate (RITALIN) 20 MG tablet Take 1 tablet (20 mg total) by mouth 3 (three) times daily with meals. 90 tablet 0   [START ON 01/28/2023] methylphenidate (RITALIN) 20 MG tablet Take 1 tablet (20 mg total) by mouth 3 (three) times daily with meals. 90 tablet 0   No current  facility-administered medications for this visit.    Medication Side Effects: None  Allergies:  Allergies  Allergen Reactions   Wellbutrin [Bupropion] Photosensitivity and Anxiety    Past Medical History:  Diagnosis Date   Anxiety    Arthritis    Bipolar disorder    Depression    Gait disorder 12/17/2013   GERD (gastroesophageal reflux disease)    Primary localized osteoarthritis of right knee 11/06/2016   PTSD (post-traumatic stress disorder)     Family History  Problem Relation Age of Onset   Heart failure Father    Cancer Father    COPD Mother    Cancer Sister     Social History   Socioeconomic History   Marital status: Married    Spouse name: Not on file   Number of children: Not on file   Years of education: 20  Highest education level: Not on file  Occupational History   Not on file  Tobacco Use   Smoking status: Never   Smokeless tobacco: Never  Substance and Sexual Activity   Alcohol use: No   Drug use: No   Sexual activity: Not on file  Other Topics Concern   Not on file  Social History Narrative   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   Please see review of systems for further details on the patient's review from today.   Objective:   Physical Exam:  There were no vitals taken for this visit.  Physical Exam Constitutional:      General: He is not in acute distress. Musculoskeletal:        General: No deformity.  Neurological:     Mental Status: He is alert and oriented to person, place, and time.     Cranial Nerves:  No dysarthria.     Coordination: Coordination normal.  Psychiatric:        Attention and Perception: Attention and perception normal. He does not perceive auditory or visual hallucinations.        Mood and Affect: Mood is anxious and depressed. Affect is not labile, blunt or inappropriate.        Speech: Speech normal.        Behavior: Behavior normal. Behavior is cooperative.        Thought Content: Thought content normal. Thought content is not paranoid or delusional. Thought content does not include homicidal or suicidal ideation. Thought content does not include suicidal plan.        Cognition and Memory: Cognition normal. He does not exhibit impaired recent memory.        Judgment: Judgment normal.     Comments: Insight fair to good. Overall satisfied with meds depression and anxiety better with meds not gone STM limited to word-finding and not likely to be clinically sig     Lab Review:     Component Value Date/Time   NA 134 (L) 11/07/2016 0533   K 3.9 11/07/2016 0533   CL 99 (L) 11/07/2016 0533   CO2 26 11/07/2016 0533   GLUCOSE 125 (H) 11/07/2016 0533   BUN 6 11/07/2016 0533   CREATININE 0.99 11/07/2016 0533   CALCIUM 8.4 (L) 11/07/2016 0533   GFRNONAA >60 11/07/2016 0533   GFRAA >60 11/07/2016 0533       Component Value Date/Time   WBC 6.8 11/07/2016 0533   RBC 3.79 (L) 11/07/2016 0533   HGB 11.5 (L) 11/07/2016 0533   HCT 35.0 (L) 11/07/2016 0533   PLT 196 11/07/2016 0533   MCV 92.3 11/07/2016 0533   MCH 30.3 11/07/2016 0533   MCHC 32.9 11/07/2016 0533   RDW 12.9 11/07/2016 0533    No results found for: "POCLITH", "LITHIUM"   No results found for: "PHENYTOIN", "PHENOBARB", "VALPROATE", "CBMZ"   .res Assessment: Plan:    Bipolar I disorder, most recent episode depressed - Plan: methylphenidate (RITALIN) 20 MG tablet  Attention deficit hyperactivity disorder (ADHD), predominantly inattentive type -  Plan: methylphenidate (RITALIN) 20 MG tablet,  methylphenidate (RITALIN) 20 MG tablet, methylphenidate (RITALIN) 20 MG tablet  Social anxiety disorder  History of posttraumatic stress disorder (PTSD)  Greater than 50% of face to face time with patient was spent on counseling and coordination of care. We discussed Patient has multiple psychiatric diagnoses as noted.  The last medicine change was the addition of Concerta in June 2019 which further reduced his depressive symptoms and improved his energy and productivity.  It is not ideal to be using that in combination with the benzodiazepines and he is aware of that and trying to minimize the amount of benzodiazepine.  He is tolerating his medications well.  He does not want medicine changes.  No medicine changes today.  He feels fine with the meds but anxious in the afternoon more. Continue the following meds: Rtialin 20 TID Quetiapine 400 mg nightly Clonazepam 1/2 tablet twice a day and 1 tablet at 4 PM and 1/2 tablet as needed anxiety Fluoxetine 80 mg daily Lamotrigine 200 mg daily Gabapentin 800 mg 3 times daily  Helps pain and anxiety  Discussed the polypharmacy again which is not ideal but has been helpful and he is tolerating it.  Discussed the risks associated. Disc shortage of Ritalin affecting modd.  Disc the off-label use of N-Acetylcysteine (NAC) at 600 mg capsule 1-2 daily to help with mild cognitive problems.  It can be combined with a B-complex vitamin (or specifically with B-12 and methylfolate)  have been shown to sometimes enhance the effect. Has not tried this yet. Taking methylfolate  We discussed the short-term risks associated with benzodiazepines including sedation and increased fall risk among others.  Discussed long-term side effect risk including dependence, potential withdrawal symptoms, and the potential eventual dose-related risk of dementia.  But recent studies from 2020 dispute this association between benzodiazepines and dementia risk. Newer studies in 2020  do not support an association with dementia. Still needs to take some clonazepam daily about 2 mg  bc chronic anxiety. It still works.  As noted above the patient gets benefit from gabapentin.   We both feel that it is helpful and the benefit outweighs any risks.  Additionally I am quite confident in his anxiety at least as well as his joint pain would worsen markedly off of the medication.  Supportive therapy dealing with wife's dx PD and her reaction to mood.  Taking Sinemet.  Disc strategies to help get her engaged in treatment by reframing it as a tremor rather than as PD.  Disc this in detail.  Disc wife's worsening paranoia. And how to communicate about this to neuro.  Follow-up 6 months  Meredith Staggers MD, DFAPA  Please see After Visit Summary for patient specific instructions.  Future Appointments  Date Time Provider Department Center  12/17/2022  3:00 PM Robley Fries, PhD CP-CP None  01/07/2023  1:00 PM Robley Fries, PhD CP-CP None  01/28/2023  1:00 PM Robley Fries, PhD CP-CP None  02/04/2023  8:30 AM Rosann Auerbach, PhD [facility] None  02/04/2023  9:30 AM LBN- NEUROPSYCH TECH [facility] None  02/11/2023 10:00 AM Rosann Auerbach, PhD [facility] None    No orders of the defined types were placed in this encounter.     -------------------------------

## 2022-12-05 ENCOUNTER — Other Ambulatory Visit: Payer: Self-pay | Admitting: Psychiatry

## 2022-12-05 DIAGNOSIS — Z8659 Personal history of other mental and behavioral disorders: Secondary | ICD-10-CM

## 2022-12-05 DIAGNOSIS — F4010 Social phobia, unspecified: Secondary | ICD-10-CM

## 2022-12-17 ENCOUNTER — Ambulatory Visit (INDEPENDENT_AMBULATORY_CARE_PROVIDER_SITE_OTHER): Payer: Medicare Other | Admitting: Psychiatry

## 2022-12-17 DIAGNOSIS — F341 Dysthymic disorder: Secondary | ICD-10-CM | POA: Diagnosis not present

## 2022-12-17 DIAGNOSIS — Z636 Dependent relative needing care at home: Secondary | ICD-10-CM

## 2022-12-17 DIAGNOSIS — F4010 Social phobia, unspecified: Secondary | ICD-10-CM | POA: Diagnosis not present

## 2022-12-17 DIAGNOSIS — Z8659 Personal history of other mental and behavioral disorders: Secondary | ICD-10-CM

## 2022-12-17 DIAGNOSIS — F3130 Bipolar disorder, current episode depressed, mild or moderate severity, unspecified: Secondary | ICD-10-CM

## 2022-12-17 NOTE — Progress Notes (Signed)
Psychotherapy Progress Note [facility] [name], PhD LP  Patient ID: TRANELL GUTZMER [facility])    MRN: [id number] Therapy format: Individual psychotherapy Date: 12/17/2022      Start: 3:19p     Stop: 4:09p     Time Spent: 50 min Location: In-person   Session narrative (presenting needs, interim history, self-report of stressors and symptoms, applications of prior therapy, status changes, and interventions made in session) [name] has gotten to where she doesn't really want to go on errands, but can wait at home, so not waiting for him today.  Her resting tremor continues, but she can still work reliably, and she takes her antiparkinsonian meds despite still thinking she really doesn't have Parkinson's.  Affirmed being effective in bringing her to rational treatment anyway.  [name] still wakes up in the night, now making some loud moaning sounds in her sleep that seem to be nightmares.  Knows she's had problems with mobility that might have been bothering her.   Briefly discussed what he can do for her.  After an administrative headache, he is back on Ritalin.  Feels better for it.  Advised ensure proper timing and notice if he seems to develop any stimulant vs. sedative issue.  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  Akarsh has felt like he shouldn't confront or ask, for fear of the price going up.  Supportively confronted the depressive (and abuse-related) "trick" of talking himself out of legitimate assertiveness and reinforced his right to ask after the way it's going and if needed to threaten restricted payment.  Encouraged to ask anything, OK to know, can still decide to comply or defy, and asking in itself may put unaccountable behavior on notice.  Overall, been more laid back, especially since laying down some responsibilities and settling some long concerns.  Accepting that he is fundamentally an  introvert, not designed for a lot of socializing.  Also that he will not become highly educated, the way he had felt he should taking all those community college and church-based courses.  Devotionally, has helped to watch "The Chosen", which really humanizes the disciples and he finds very relatable.  Affirmed and encouraged.  Therapeutic modalities: Cognitive Behavioral Therapy, Solution-Oriented/Positive Psychology, Environmental manager, and Faith-sensitive  Mental Status/Observations:  Appearance:   Casual     Behavior:  Appropriate  Motor:  Normal and measured  Speech/Language:   Clear and Coherent  Affect:  Appropriate  Mood:  dysthymic  Thought process:  normal  Thought content:    WNL  Sensory/Perceptual disturbances:    WNL  Orientation:  Fully oriented  Attention:  Good    Concentration:  Good  Memory:  WNL  Insight:    Fair  Judgment:   Good  Impulse Control:  Good   Risk Assessment: Danger to Self: No Self-injurious Behavior: No Danger to Others: No Physical Aggression / Violence: No Duty to Warn: No Access to Firearms a concern: No  Assessment of progress:  progressing  Diagnosis:   ICD-10-CM   1. Bipolar I disorder, most recent episode depressed ([facility])  F31.30     2. Severe early onset dysthymic disorder, in partial remission, with anxious distress, with intermittent major depressive episodes, with current episode  F34.1     3. Social anxiety disorder  F40.10     4. History of posttraumatic stress disorder (PTSD)  Z86.59     5. Caregiver stress  Z63.6  Plan:  Persistent SI -- Maintain safety and pledge to live.  Self-affirm reasons to live.  Where needed, invoke D Lisa's memory and wishes for her father. Marital support -- Self-affirm doing good work by [employer] and more to come to their life together Programme researcher, broadcasting/film/video social supports through church and other available Youth worker best health habits including activity nutrition, and neuroprotective supplements  like turmeric, omega 3, B complex, vitamin D Cognitive issues -- Are about spoiled sleep and Klonopin SE until proven otherwise.  Work on sleep hygiene and look into Klonopin issue.  Monitor for stimulant v. sedative problem. Worth remaining off stimulant.  Manage caffeine if needed. Longterm recommend weaning Klonopin Other recommendations/advice as may be noted above Continue to utilize previously learned skills ad lib Maintain medication as prescribed and work faithfully with relevant prescriber(s) if any changes are desired or seem indicated Call the clinic on-call service, 988/hotline, 911, or present to [facility] or ER if any life-threatening psychiatric crisis Return for time as already scheduled. Already scheduled visit in this office 01/07/2023.  Robley Fries, PhD [name], PhD LP Clinical Psychologist, [facility] [address] [place], [place] [address] [phone]

## 2023-01-07 ENCOUNTER — Ambulatory Visit: Payer: Medicare Other | Admitting: Psychiatry

## 2023-01-11 ENCOUNTER — Institutional Professional Consult (permissible substitution): Payer: Medicare Other | Admitting: Psychology

## 2023-01-11 ENCOUNTER — Ambulatory Visit: Payer: Medicare Other

## 2023-01-28 ENCOUNTER — Ambulatory Visit: Payer: Medicare Other | Admitting: Psychiatry

## 2023-01-31 ENCOUNTER — Other Ambulatory Visit: Payer: Self-pay | Admitting: Psychiatry

## 2023-01-31 ENCOUNTER — Telehealth: Payer: Self-pay | Admitting: Psychiatry

## 2023-01-31 DIAGNOSIS — F900 Attention-deficit hyperactivity disorder, predominantly inattentive type: Secondary | ICD-10-CM

## 2023-01-31 DIAGNOSIS — F3130 Bipolar disorder, current episode depressed, mild or moderate severity, unspecified: Secondary | ICD-10-CM

## 2023-01-31 MED ORDER — METHYLPHENIDATE HCL 20 MG PO TABS: 20.0000 mg | ORAL_TABLET | Freq: Three times a day (TID) | ORAL | 0 refills | Status: DC

## 2023-01-31 NOTE — Telephone Encounter (Signed)
Patient called in for refill for Methylphenidate 20mg . Ph: [phone] Appt 10/7 Pharmacy [facility] [address] [place], [place]

## 2023-02-04 ENCOUNTER — Encounter: Payer: Medicare Other | Admitting: Psychology

## 2023-02-11 ENCOUNTER — Encounter: Payer: Medicare Other | Admitting: Psychology

## 2023-02-20 ENCOUNTER — Other Ambulatory Visit: Payer: Self-pay | Admitting: Psychiatry

## 2023-02-20 DIAGNOSIS — F4010 Social phobia, unspecified: Secondary | ICD-10-CM

## 2023-02-20 DIAGNOSIS — Z8659 Personal history of other mental and behavioral disorders: Secondary | ICD-10-CM

## 2023-02-25 ENCOUNTER — Ambulatory Visit (INDEPENDENT_AMBULATORY_CARE_PROVIDER_SITE_OTHER): Payer: Medicare Other | Admitting: Psychiatry

## 2023-02-25 DIAGNOSIS — F3130 Bipolar disorder, current episode depressed, mild or moderate severity, unspecified: Secondary | ICD-10-CM

## 2023-02-25 DIAGNOSIS — F341 Dysthymic disorder: Secondary | ICD-10-CM | POA: Diagnosis not present

## 2023-02-25 DIAGNOSIS — F4010 Social phobia, unspecified: Secondary | ICD-10-CM

## 2023-02-25 DIAGNOSIS — Z636 Dependent relative needing care at home: Secondary | ICD-10-CM

## 2023-02-25 DIAGNOSIS — Z8659 Personal history of other mental and behavioral disorders: Secondary | ICD-10-CM

## 2023-02-25 NOTE — Progress Notes (Signed)
Psychotherapy Progress Note [facility] [name], PhD LP  Patient ID: Mark Hull [facility])    MRN: [id number] Therapy format: Individual psychotherapy Date: 02/25/2023      Start: 9:18a     Stop: 10:06a     Time Spent: 48 min Location: In-person   Session narrative (presenting needs, interim history, self-report of stressors and symptoms, applications of prior therapy, status changes, and interventions made in session) Had terrifying experience running out of Klonopin, alone at home, when mail order was much slowed down and failed to honor an expedite request.  Took a half Seroquel but had feeling like he'd pass out and was afraid he would die of an aneurysm.  Called Mark Hull (3 mi away) to take him to the fire station (1/2 mi away) for emergency first aid, but she freaked out, got lost, ran over a curb, and got stuck.  Eventually got himself to the fire dept, vitals good, assessed as a panic attack.  Called his brother, got a loaner of Ativan to tide him over and used them very judiciously.  Feels silly now to recognize it was a panic attack.  While in withdrawal, did smartly knock off Ritalin and try an extra Seroquel by day.  On review, his allotment of 2 mg per day has been scheduled for 0.5mg  qAM, 1.0mg  5pm, 0.5mg  qPM, but he mostly keeps to just 0.5mg  @ 5pm and has been stockpiling the leftovers, in case something like this happened, only he can't find it (having hidden it to be sure a workman didn't raid it).  Re. 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  Affirmed his use of coping skills, his temperance with sedative medication over time, and his problem-solving in seeking FD assessment.  Destigmatized seeking brother's help with replacement sedative, clear he was only trying to service withdrawal, not abuse anything.  Educated about tolerance (now about 25 years), medication service, and  the likelihood that stockpiled meds have lost potency.    As for home stresses, Mark Hull is at the point with Parkinson's where she needs him close by for security.  Tends to ask nervous, repetitious questions for reassurance.  Knows her work is slowing down a lot.  She's resisting antiparkinson med but still taking reliably with [name]'s support.  New dfx sleeping, and ankle swelling when on her feet.  Does plan on bringing up with neurology ([facility]) soon.  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  Support/empathy provided.   Therapeutic modalities: Cognitive Behavioral Therapy, Solution-Oriented/Positive Psychology, and Ego-Supportive  Mental Status/Observations:  Appearance:   Casual     Behavior:  Appropriate  Motor:  Normal  Speech/Language:   Clear and Coherent  Affect:  Appropriate  Mood:  anxious and dysthymic  Thought process:  normal  Thought content:    WNL  Sensory/Perceptual disturbances:    WNL  Orientation:  Fully oriented  Attention:  Good    Concentration:  Good  Memory:  WNL  Insight:    Good  Judgment:   Good  Impulse Control:  Good   Risk Assessment: Danger to Self: No Self-injurious Behavior: No Danger to Others: No Physical Aggression / Violence: No Duty to Warn: No Access to Firearms a concern: No  Assessment of progress:  stabilized  Diagnosis:   ICD-10-CM   1. Bipolar I disorder, most  recent episode depressed (HCC)  F31.30     2. Severe early onset dysthymic disorder, in partial remission, with anxious distress, with intermittent major depressive episodes, with current episode  F34.1     3. Caregiver stress  Z63.6     4. Social anxiety disorder  F40.10     5. History of posttraumatic stress disorder (PTSD)  Z86.59      Plan:  Marital support -- Self-affirm service to [place] and more to come to their life together.  Engage her medical help actively, especially as regards Parkinson's  management. Social supports -- Programme researcher, broadcasting/film/video through church and other available connections Health habits -- Maintain resiliency through including activity, nutrition, and neuroprotective supplements like turmeric, omega 3, B complex, vitamin D Cognitive issues -- Regarded as no mor than normal aging, but spoiled sleep and Klonopin SE can be involved.  Maintain sleep hygiene, monitor for stimulant v. sedative effects and consider titrating one or both. Chronic, intrusive SI -- Maintain safety and pledge to live, self-affirm reasons to live, and where needed, invoke D Lisa's memory and wishes for her father. Other issues -- If motivated, can work on improving anxiety management skills or traumatic memories Other recommendations/advice -- As may be noted above.  Continue to utilize previously learned skills ad lib. Medication compliance -- Maintain medication as prescribed and work faithfully with relevant prescriber(s) if any changes are desired or seem indicated. Crisis service -- Aware of call list and work-in appts.  Call the clinic on-call service, 988/hotline, 911, or present to [facility] or ER if any life-threatening psychiatric crisis. Followup -- Return for time as available.  Next scheduled visit with me Visit date not found.  Next scheduled in this office 06/03/2023.  Robley Fries, PhD [name], PhD LP Clinical Psychologist, [facility] [address], [address] [place], [place] [address] [phone]

## 2023-03-01 ENCOUNTER — Other Ambulatory Visit: Payer: Self-pay | Admitting: Psychiatry

## 2023-03-01 DIAGNOSIS — Z8659 Personal history of other mental and behavioral disorders: Secondary | ICD-10-CM

## 2023-03-01 DIAGNOSIS — F4010 Social phobia, unspecified: Secondary | ICD-10-CM

## 2023-03-08 LAB — COLOGUARD: COLOGUARD: NEGATIVE

## 2023-03-08 LAB — EXTERNAL GENERIC LAB PROCEDURE: COLOGUARD: NEGATIVE

## 2023-03-12 ENCOUNTER — Other Ambulatory Visit: Payer: Self-pay | Admitting: Psychiatry

## 2023-03-12 ENCOUNTER — Telehealth: Payer: Self-pay | Admitting: Psychiatry

## 2023-03-12 DIAGNOSIS — Z8659 Personal history of other mental and behavioral disorders: Secondary | ICD-10-CM

## 2023-03-12 DIAGNOSIS — F4010 Social phobia, unspecified: Secondary | ICD-10-CM

## 2023-03-12 MED ORDER — CLONAZEPAM 1 MG PO TABS: ORAL_TABLET | ORAL | 2 refills | Status: DC

## 2023-03-12 NOTE — Progress Notes (Signed)
 Rx corrected.

## 2023-03-12 NOTE — Telephone Encounter (Signed)
[facility] called needing clarification on Giovoni's Clonazepam 1 mg. Directions were unclear. Their phone number is [phone]. [place] [id number].

## 2023-03-12 NOTE — Telephone Encounter (Signed)
Pharmacy asking for clarification on clonazepam dosing.

## 2023-03-12 NOTE — Telephone Encounter (Signed)
Sent corrected RX.

## 2023-03-14 ENCOUNTER — Other Ambulatory Visit: Payer: Self-pay | Admitting: Psychiatry

## 2023-03-14 DIAGNOSIS — F3130 Bipolar disorder, current episode depressed, mild or moderate severity, unspecified: Secondary | ICD-10-CM

## 2023-03-14 DIAGNOSIS — F4010 Social phobia, unspecified: Secondary | ICD-10-CM

## 2023-03-25 ENCOUNTER — Other Ambulatory Visit: Payer: Self-pay

## 2023-03-25 ENCOUNTER — Telehealth: Payer: Self-pay | Admitting: Psychiatry

## 2023-03-25 DIAGNOSIS — F4010 Social phobia, unspecified: Secondary | ICD-10-CM

## 2023-03-25 DIAGNOSIS — Z8659 Personal history of other mental and behavioral disorders: Secondary | ICD-10-CM

## 2023-03-25 MED ORDER — CLONAZEPAM 1 MG PO TABS: ORAL_TABLET | ORAL | 0 refills | Status: DC

## 2023-03-25 NOTE — Telephone Encounter (Signed)
Pt LVM @ 1:25p requesting refill of Klonopin to   [facility] [address], [place] - [address] [place] [address] Phone: [phone]  Fax: [phone]   He said he is having trouble getting it filled with [facility] and he took his last pill today.  Next appt 10/7

## 2023-03-25 NOTE — Telephone Encounter (Signed)
Per [facility] waiting for post office to pick up. Pended a 7-day supply. LVM.

## 2023-04-22 ENCOUNTER — Ambulatory Visit (INDEPENDENT_AMBULATORY_CARE_PROVIDER_SITE_OTHER): Payer: Medicare Other | Admitting: Psychiatry

## 2023-04-22 DIAGNOSIS — F341 Dysthymic disorder: Secondary | ICD-10-CM

## 2023-04-22 DIAGNOSIS — Z8659 Personal history of other mental and behavioral disorders: Secondary | ICD-10-CM

## 2023-04-22 DIAGNOSIS — Z636 Dependent relative needing care at home: Secondary | ICD-10-CM

## 2023-04-22 DIAGNOSIS — F4010 Social phobia, unspecified: Secondary | ICD-10-CM | POA: Diagnosis not present

## 2023-04-22 DIAGNOSIS — F3130 Bipolar disorder, current episode depressed, mild or moderate severity, unspecified: Secondary | ICD-10-CM | POA: Diagnosis not present

## 2023-04-23 ENCOUNTER — Other Ambulatory Visit: Payer: Self-pay | Admitting: Psychiatry

## 2023-04-23 DIAGNOSIS — Z8659 Personal history of other mental and behavioral disorders: Secondary | ICD-10-CM

## 2023-04-23 DIAGNOSIS — F4010 Social phobia, unspecified: Secondary | ICD-10-CM

## 2023-04-24 ENCOUNTER — Telehealth: Payer: Self-pay | Admitting: Psychiatry

## 2023-04-24 NOTE — Telephone Encounter (Signed)
LF 8/2, due 8/30

## 2023-04-24 NOTE — Telephone Encounter (Signed)
Patient lvm at 10:25 requesting refill for Methylphenidate 20mg . Ph: [phone] Appt 10/7 Pharmacy [facility] [address] [place]

## 2023-04-26 ENCOUNTER — Other Ambulatory Visit: Payer: Self-pay

## 2023-04-26 DIAGNOSIS — F3130 Bipolar disorder, current episode depressed, mild or moderate severity, unspecified: Secondary | ICD-10-CM

## 2023-04-26 DIAGNOSIS — F900 Attention-deficit hyperactivity disorder, predominantly inattentive type: Secondary | ICD-10-CM

## 2023-04-26 MED ORDER — METHYLPHENIDATE HCL 20 MG PO TABS
20.0000 mg | ORAL_TABLET | Freq: Three times a day (TID) | ORAL | 0 refills | Status: DC
Start: 2023-06-21 — End: 2023-06-03

## 2023-04-26 MED ORDER — METHYLPHENIDATE HCL 20 MG PO TABS
20.0000 mg | ORAL_TABLET | Freq: Three times a day (TID) | ORAL | 0 refills | Status: DC
Start: 2023-04-26 — End: 2023-06-21

## 2023-04-26 MED ORDER — METHYLPHENIDATE HCL 20 MG PO TABS
20.0000 mg | ORAL_TABLET | Freq: Three times a day (TID) | ORAL | 0 refills | Status: DC
Start: 2023-05-24 — End: 2023-06-03

## 2023-04-26 NOTE — Telephone Encounter (Signed)
Pended.

## 2023-04-29 NOTE — Progress Notes (Signed)
Psychotherapy Progress Note [facility] [name], PhD LP  Patient ID: PAT DLOUHY [facility])    MRN: [id number] Therapy format: Individual psychotherapy Date: 04/22/2023      Start: 1:10p     Stop: 2:00p     Time Spent: 50 min Location: In-person   Session narrative (presenting needs, interim history, self-report of stressors and symptoms, applications of prior therapy, status changes, and interventions made in session) [facility] network server issue today.  Notes recovered later.  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  Cathy still resistant to the diagnosis of Parkinson's but reluctantly complying with meds.  She c/o the bd making her stiff, when he is clear it's actually the disease.  Thinking of maybe refinancing the home and putting in an exercise pool, with hope she would use it but at least certainty he would.    Medication supply restored after last session's crisis, and says Dr.Cottle agreed to a Rx where he could once again stockpile clonazepam in case of another outage.  Concerns for his short-term memory, and says sometimes he's waking up expecting to be in a former house.  Normalized as waking sleep phenomena, and possibly emotional issus and normal aging.  No concerns for his deep sleep, he says, after several months now of Cathy not waking him up; however, he habitually falls asleep in front of the TV in the evening, then wife puts game shows on and tends to stay up late while he tries to sleep, pretty often wakes up after first 3-4 hrs to find it still on, so agrees he typically is getting some degraded sleep still.  Discussed possibilities for getting wife to modify her late-TV habit, or use headphones, but she has them and has not learned how, and he is leery of upsetting her.  Other option earplugs for himself.    Other than that, encouraged approaching [name] about getting  her spasticity treated.  Don't have to call it PD, and actually don't have to debate why any of her symptoms, just focus on whether she wants them better and is willing to do what works.  Marveling at being 19 now.  Recalled when he thought 49 meant the end of life.  Affirmed overcoming the worst of PTSD earlier.  Therapeutic modalities: Cognitive Behavioral Therapy, Solution-Oriented/Positive Psychology, and Ego-Supportive  Mental Status/Observations:  Appearance:   Casual     Behavior:  Appropriate  Motor:  Normal  Speech/Language:   Clear and Coherent  Affect:  Appropriate  Mood:  A bit subdued  Thought process:  normal  Thought content:    WNL  Sensory/Perceptual disturbances:    WNL  Orientation:  Fully oriented  Attention:  Good    Concentration:  Good  Memory:  WNL  Insight:    Good  Judgment:   Good  Impulse Control:  Good   Risk Assessment: Danger to Self: No Self-injurious Behavior: No Danger to Others: No Physical Aggression / Violence: No Duty to Warn: No Access to Firearms a concern: No  Assessment of progress:  progressing  Diagnosis:   ICD-10-CM   1. Social anxiety disorder  F40.10     2. History of posttraumatic stress disorder (PTSD)  Z86.59     3. Bipolar I disorder, most recent episode depressed (HCC)  F31.30     4. Severe early onset dysthymic disorder, in partial remission, with anxious distress, with intermittent  major depressive episodes, with current episode  F34.1     5. Caregiver stress  Z63.6      Plan:  Marital support -- Self-affirm service to [place] and more to come to their life together.  Engage her medical help actively, especially as regards Parkinson's management. Social supports -- Programme researcher, broadcasting/film/video through church and other available connections Health habits -- Maintain resiliency through including activity, nutrition, and neuroprotective supplements like turmeric, omega 3, B complex, vitamin D Cognitive issues -- Regarded as no mor than  normal aging, but spoiled sleep and Klonopin SE can be involved.  Maintain sleep hygiene, monitor for stimulant v. sedative effects and consider titrating one or both. Chronic, intrusive SI -- Maintain safety and pledge to live, self-affirm reasons to live, and where needed, invoke D Lisa's memory and wishes for her father. Other issues -- If motivated, can work on improving anxiety management skills or traumatic memories Other recommendations/advice -- As may be noted above.  Continue to utilize previously learned skills ad lib. Medication compliance -- Maintain medication as prescribed and work faithfully with relevant prescriber(s) if any changes are desired or seem indicated. Crisis service -- Aware of call list and work-in appts.  Call the clinic on-call service, 988/hotline, 911, or present to [facility] or ER if any life-threatening psychiatric crisis. Followup -- Return for time as already scheduled, avail earlier @ PT's need.  Next scheduled visit with me 06/24/2023.  Next scheduled in this office 04/23/2023.  Robley Fries, PhD [name], PhD LP Clinical Psychologist, [facility] [address], [address] [place], [place] [address] [phone]

## 2023-05-21 ENCOUNTER — Other Ambulatory Visit: Payer: Self-pay | Admitting: Psychiatry

## 2023-05-21 DIAGNOSIS — Z8659 Personal history of other mental and behavioral disorders: Secondary | ICD-10-CM

## 2023-05-21 DIAGNOSIS — F4010 Social phobia, unspecified: Secondary | ICD-10-CM

## 2023-05-26 ENCOUNTER — Other Ambulatory Visit: Payer: Self-pay | Admitting: Psychiatry

## 2023-05-26 DIAGNOSIS — F4010 Social phobia, unspecified: Secondary | ICD-10-CM

## 2023-05-26 DIAGNOSIS — F3130 Bipolar disorder, current episode depressed, mild or moderate severity, unspecified: Secondary | ICD-10-CM

## 2023-05-27 ENCOUNTER — Telehealth: Payer: Self-pay | Admitting: Psychiatry

## 2023-05-27 NOTE — Telephone Encounter (Signed)
Patient lvm at 10:45 asking if CC could give a referral for a neurologist in [place], [place]. States that he is forgetting things. Ph: [phone]

## 2023-05-28 NOTE — Telephone Encounter (Signed)
Patient has an appt on 10/7. Said he would discuss with Dr. Jennelle Human at that time.

## 2023-06-03 ENCOUNTER — Ambulatory Visit: Payer: Medicare Other | Admitting: Psychiatry

## 2023-06-03 ENCOUNTER — Encounter: Payer: Self-pay | Admitting: Psychiatry

## 2023-06-03 DIAGNOSIS — F3130 Bipolar disorder, current episode depressed, mild or moderate severity, unspecified: Secondary | ICD-10-CM

## 2023-06-03 DIAGNOSIS — Z8659 Personal history of other mental and behavioral disorders: Secondary | ICD-10-CM | POA: Diagnosis not present

## 2023-06-03 DIAGNOSIS — F4010 Social phobia, unspecified: Secondary | ICD-10-CM | POA: Diagnosis not present

## 2023-06-03 DIAGNOSIS — F900 Attention-deficit hyperactivity disorder, predominantly inattentive type: Secondary | ICD-10-CM

## 2023-06-03 MED ORDER — METHYLPHENIDATE HCL 20 MG PO TABS
20.0000 mg | ORAL_TABLET | Freq: Three times a day (TID) | ORAL | 0 refills | Status: DC
Start: 2023-07-01 — End: 2023-09-19

## 2023-06-03 MED ORDER — CLONAZEPAM 1 MG PO TABS
ORAL_TABLET | ORAL | 1 refills | Status: DC
Start: 2023-06-03 — End: 2023-12-02

## 2023-06-03 MED ORDER — FLUOXETINE HCL 40 MG PO CAPS
80.0000 mg | ORAL_CAPSULE | Freq: Every day | ORAL | 0 refills | Status: DC
Start: 2023-06-03 — End: 2023-10-14

## 2023-06-03 MED ORDER — METHYLPHENIDATE HCL 20 MG PO TABS
20.0000 mg | ORAL_TABLET | Freq: Three times a day (TID) | ORAL | 0 refills | Status: DC
Start: 2023-07-29 — End: 2023-12-02

## 2023-06-03 NOTE — Progress Notes (Signed)
STEED KANAAN [id number] 23-Jul-1956 67 y.o.   Subjective:   Patient ID:  Mark Hull is a 67 y.o. (DOB Jun 19, 1956) male.  Chief Complaint:  Chief Complaint  Patient presents with   Follow-up   Depression   Anxiety   ADD    Anxiety Symptoms include decreased concentration and nervous/anxious behavior. Patient reports no confusion, palpitations or suicidal ideas.    Depression        Associated symptoms include decreased concentration and myalgias.  Associated symptoms include no suicidal ideas.  Past medical history includes anxiety.    TAESEAN RETH presents to the office today for follow-up of mood anxiety.  seen November 2020.  No meds were changed.  03/2020 appointment with the following noted: No meds were changed Stress wife, Olegario Messier,  [place] PD from 2 doctors and she won't accept the dx or treatment and he feels stuck and anxious over it.  He's trying to move to one level house.   Also stress F died Feb 14, 2020 at 68 yo.   Tolerating meds. No desire to change meds.  12/08/2020 appointment with the following noted:  No SE wth meds and compliant without changes. Doing good overall.   1 month ago W CA under tongue and surgery at [facility].  Expected to be CA free now.  He had to care for her.   She won't accept dx PD and is slow. Not on meds for it. Disc this stress.   Accepted station in life and let things go and feels better than in a long time. Plan: No medicine changes today.  He feels fine with the meds but anxious in the afternoon more. Continue the following meds: Concerta 54 mg every morning Quetiapine 400 mg nightly Clonazepam 1/2 tablet twice a day and 1 tablet at 4 PM and 1/2 tablet as needed anxiety Fluoxetine 80 mg daily Lamotrigine 200 mg daily Gabapentin 800 mg 3 times daily  Helps pain and anxiety Mostly stopped Ambien 5 mg nightly as needed insomnia  06/05/21 appt noted: Doing well and enjoying things.  Active and productive.  Being outside helps. No SE but  some wordfinding problems. Doing very well overall.  Things improved with the Concerta added February 13, 2018.  Level of depression is improved not gone.   Plan: No med changes  12/04/2021 appointment with the following noted: Has been through a period of anxiety since he was last year.  He wondered if the Ritalin was contributing but at the time decided to stay on it. Wife with PD and dx CA last year.  TKR and missed a lot of work last year and went through savings.  She is doing better now.   Mood ok other than anxious before noted but that's also better.Depression improved 4/10.  Anxiety manageable.  Sleep is good.  ritalin helps energy.  No significant manic symptoms since here.  No irritability or agitation.  Minimal racing thoughts except when anxious.  Satisfied with the meds. No SE Plan: no med changes  05/30/2022 appointment noted: No med changes Had trouble getting Ritalin with shortage but got it. Saw Marlowe Kays [facility] ref by PCP. MoCA today is 29/30, visuospatial 4/5, delayed recall 5/5.  Sched for neuropsych testing 06-20-24May be age related memory concerns bc neuro didn't suspect serious memory concerns. Overall very well with mental health Not taking Ambien in  a year or 2.  Sleep is good. No SE with meds. Still socially inactive and avoidant but doesn't  see it as a problem.  Wife's PD is worse and not bothered by his psych issues.  She has some paranoia.   Plan: no changes  12/03/22 appt: No med changes.  Having hard time getting Ritalin.  At one point out for a month. No other med concerns or SE. Dep 4/10 but worse off Ritalin for a few days.  It helps depressiona dn focus. No other concerns.  06/03/23 appt : Still consistent with meds.   Try to help others when he can.   Doing well with the meds.  Not as depressed.  Taking pressure off himself and therefore more relaxed.  I feel a lot better. No SE px. Stress W PD. Still gets anxious about 5 pm without  reason.  Will get anxious in public still and will have to leave public places at times.   Past Psychiatric Medication Trials: Viibryd, Wellbutrin, venlafaxine, fluoxetine, paroxetine, sertraline Lithium, gabapentin, quetiapine, Latuda, Abilify, Vraylar side effects, lamotrigine Concerta, pramipexole, Nuvigil Clonazepam, Ambien  Review of Systems:  Review of Systems  Cardiovascular:  Negative for palpitations.  Genitourinary:  Negative for difficulty urinating.  Musculoskeletal:  Positive for arthralgias and myalgias.  Neurological:  Negative for tremors.  Psychiatric/Behavioral:  Positive for decreased concentration. Negative for agitation, behavioral problems, confusion, dysphoric mood, hallucinations, self-injury, sleep disturbance and suicidal ideas. The patient is nervous/anxious. The patient is not hyperactive.     Medications: I have reviewed the patient's current medications.  Current Outpatient Medications  Medication Sig Dispense Refill   atorvastatin (LIPITOR) 10 MG tablet Take 1 tablet by mouth daily at 6 PM.      finasteride (PROSCAR) 5 MG tablet      gabapentin (NEURONTIN) 400 MG capsule TAKE 2 CAPSULES THREE TIMES DAILY. 540 capsule 1   L-methylfolate Calcium 15 MG TABS Take 15 mg by mouth daily. 30 tablet 5   lamoTRIgine (LAMICTAL) 200 MG tablet TAKE 1 TABLET AT BEDTIME 90 tablet 3   methylphenidate (RITALIN) 20 MG tablet Take 1 tablet (20 mg total) by mouth 3 (three) times daily with meals. 90 tablet 0   Omega-3 Fatty Acids (FISH OIL PO) Take 1 capsule by mouth daily.     QUEtiapine (SEROQUEL) 400 MG tablet TAKE 2 TABLETS AT BEDTIME 180 tablet 3   vitamin B-12 (CYANOCOBALAMIN) 500 MCG tablet Take 1 tablet (500 mcg total) by mouth daily. 90 tablet 1   clonazePAM (KLONOPIN) 1 MG tablet TAKE 1/2 (ONE-HALF) TABLET BY MOUTH TWICE DAILY, TAKE 1 TABLET DAILY AT 4PM AND 1/2 (ONE-HALF) TABLET EVERY NIGHT AT BEDTIME 225 tablet 1   FLUoxetine (PROZAC) 40 MG capsule Take 2  capsules (80 mg total) by mouth daily. 180 capsule 0   [START ON 07/01/2023] methylphenidate (RITALIN) 20 MG tablet Take 1 tablet (20 mg total) by mouth 3 (three) times daily with meals. 90 tablet 0   [START ON 07/29/2023] methylphenidate (RITALIN) 20 MG tablet Take 1 tablet (20 mg total) by mouth 3 (three) times daily with meals. 90 tablet 0   No current facility-administered medications for this visit.    Medication Side Effects: None  Allergies:  Allergies  Allergen Reactions   Wellbutrin [Bupropion] Photosensitivity and Anxiety    Past Medical History:  Diagnosis Date   Anxiety    Arthritis    Bipolar disorder (HCC)    Depression    Gait disorder 12/17/2013   GERD (gastroesophageal reflux disease)    Primary localized osteoarthritis of right knee 11/06/2016   PTSD (post-traumatic stress disorder)  Family History  Problem Relation Age of Onset   Heart failure Father    Cancer Father    COPD Mother    Cancer Sister     Social History   Socioeconomic History   Marital status: Married    Spouse name: Not on file   Number of children: Not on file   Years of education: 20   Highest education level: Not on file  Occupational History   Not on file  Tobacco Use   Smoking status: Never   Smokeless tobacco: Never  Substance and Sexual Activity   Alcohol use: No   Drug use: No   Sexual activity: Not on file  Other Topics Concern   Not on file  Social History Narrative   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   Please see review of systems for  further details on the patient's review from today.   Objective:   Physical Exam:  There were no vitals taken for this visit.  Physical Exam Constitutional:      General: He is not in acute distress. Musculoskeletal:        General: No deformity.  Neurological:     Mental Status: He is alert and oriented to person, place, and time.     Cranial Nerves: No dysarthria.     Coordination: Coordination normal.  Psychiatric:        Attention and Perception: Attention and perception normal. He does not perceive auditory or visual hallucinations.        Mood and Affect: Mood is anxious. Mood is not depressed. Affect is not labile, blunt or inappropriate.        Speech: Speech normal.        Behavior: Behavior normal. Behavior is cooperative.        Thought Content: Thought content normal. Thought content is not paranoid or delusional. Thought content does not include homicidal or suicidal ideation. Thought content does not include suicidal plan.        Cognition and Memory: Cognition normal. He does not exhibit impaired recent memory.        Judgment: Judgment normal.     Comments: Insight fair to good. Overall satisfied with meds depression and anxiety better with meds not gone STM limited to word-finding and not likely to be clinically sig    Lab Review:     Component Value Date/Time   NA 134 (L) 11/07/2016 0533   K 3.9 11/07/2016 0533   CL 99 (L) 11/07/2016 0533   CO2 26 11/07/2016 0533   GLUCOSE 125 (H) 11/07/2016 0533   BUN 6 11/07/2016 0533   CREATININE 0.99 11/07/2016 0533   CALCIUM 8.4 (L) 11/07/2016 0533   GFRNONAA >60 11/07/2016 0533   GFRAA >60 11/07/2016 0533       Component Value Date/Time   WBC 6.8 11/07/2016 0533   RBC 3.79 (L) 11/07/2016 0533   HGB 11.5 (L) 11/07/2016 0533   HCT 35.0 (L) 11/07/2016 0533   PLT 196 11/07/2016 0533   MCV 92.3 11/07/2016 0533  MCH 30.3 11/07/2016 0533   MCHC 32.9 11/07/2016 0533   RDW 12.9 11/07/2016 0533    No results  found for: "POCLITH", "LITHIUM"   No results found for: "PHENYTOIN", "PHENOBARB", "VALPROATE", "CBMZ"   .res Assessment: Plan:    Bipolar I disorder, most recent episode depressed (HCC) - Plan: methylphenidate (RITALIN) 20 MG tablet, FLUoxetine (PROZAC) 40 MG capsule  Social anxiety disorder - Plan: clonazePAM (KLONOPIN) 1 MG tablet, FLUoxetine (PROZAC) 40 MG capsule  Attention deficit hyperactivity disorder (ADHD), predominantly inattentive type - Plan: methylphenidate (RITALIN) 20 MG tablet, methylphenidate (RITALIN) 20 MG tablet  History of posttraumatic stress disorder (PTSD) - Plan: clonazePAM (KLONOPIN) 1 MG tablet  30 min face to face time with patient was spent on counseling and coordination of care. We discussed Patient has multiple psychiatric diagnoses as noted.  The last medicine change was the addition of Concerta in June 2019 which further reduced his depressive symptoms and improved his energy and productivity.  This had to be switched to regular Ritalin due to shortage and cost but he is doing fine with the Ritalin.  It is not ideal to be using that in combination with the benzodiazepines and he is aware of that and trying to minimize the amount of benzodiazepine.  He is tolerating his medications well.  He does not want medicine changes.  No medicine changes indicated.  He feels fine with the meds Continue the following meds: Rtialin 20 TID Quetiapine 400 mg nightly Clonazepam 1/2 tablet twice a day and 1 tablet at 4 PM and 1/2 tablet as needed anxiety Fluoxetine 80 mg daily Lamotrigine 200 mg daily Gabapentin 800 mg 3 times daily  Helps pain and anxiety  Discussed the polypharmacy again which is not ideal but has been helpful and he is tolerating it.  Discussed the risks associated.  Disc the off-label use of N-Acetylcysteine (NAC) at 600 mg capsule 1-2 daily to help with mild cognitive problems.  It can be combined with a B-complex vitamin (or specifically with B-12 and  methylfolate)  have been shown to sometimes enhance the effect. Has not tried this yet. Taking methylfolate  We discussed the short-term risks associated with benzodiazepines including sedation and increased fall risk among others.  Discussed long-term side effect risk including dependence, potential withdrawal symptoms, and the potential eventual dose-related risk of dementia.  But recent studies from 2020 dispute this association between benzodiazepines and dementia risk. Newer studies in 2020 do not support an association with dementia. Still needs to take some clonazepam daily about 2 mg  bc chronic anxiety. It still works. Disc ways to gradually taper encouraged him to try to reduce the dose a little bit if possible while maintaining control of anxiety.  As noted above the patient gets benefit from gabapentin.   We both feel that it is helpful and the benefit outweighs any risks.  Additionally I am quite confident in his anxiety at least as well as his joint pain would worsen markedly off of the medication.  Supportive therapy dealing with wife's dx PD and her reaction to mood.  Taking Sinemet. Disc this in detail.  Disc wife's hx worsening paranoia.  Follow-up 6 months  Meredith Staggers MD, DFAPA  Please see After Visit Summary for patient specific instructions.  Future Appointments  Date Time Provider Department Center  06/24/2023 10:00 AM Robley Fries, PhD CP-CP None    No orders of the defined types were placed in this encounter.     -------------------------------

## 2023-06-21 ENCOUNTER — Telehealth: Payer: Self-pay | Admitting: Psychiatry

## 2023-06-21 ENCOUNTER — Other Ambulatory Visit: Payer: Self-pay

## 2023-06-21 DIAGNOSIS — F900 Attention-deficit hyperactivity disorder, predominantly inattentive type: Secondary | ICD-10-CM

## 2023-06-21 MED ORDER — METHYLPHENIDATE HCL 20 MG PO TABS: 20.0000 mg | ORAL_TABLET | Freq: Three times a day (TID) | ORAL | 0 refills | Status: DC

## 2023-06-21 NOTE — Telephone Encounter (Signed)
Next appt is 12/02/23. Requesting refill on Ritalin 20 mg called to:  [facility] [address] [place] - [address]   Phone: [phone]  Fax: [phone]

## 2023-06-21 NOTE — Telephone Encounter (Signed)
Pended.

## 2023-06-24 ENCOUNTER — Ambulatory Visit: Payer: Medicare Other | Admitting: Psychiatry

## 2023-08-26 ENCOUNTER — Other Ambulatory Visit: Payer: Self-pay | Admitting: Psychiatry

## 2023-08-26 DIAGNOSIS — Z8659 Personal history of other mental and behavioral disorders: Secondary | ICD-10-CM

## 2023-08-26 DIAGNOSIS — F4010 Social phobia, unspecified: Secondary | ICD-10-CM

## 2023-08-27 NOTE — Telephone Encounter (Signed)
I wasn't go based on the pharmacy request. I asked because there were a lot of different dates that didn't make sense. My primary concern was the 12/27 on PMP.  I missed that it was a different phramacy. Thank you. I will refuse.

## 2023-08-27 NOTE — Telephone Encounter (Addendum)
 You can't go by the date listed on the pharmacy RF request.  Go by the PMPD date for last fill, but look to be sure there isn't already a pending RF. In this case there isn't one.  This request is from a different pharmacy. Ok to refuse.  Also Rx says 10/7, but he had a RF.

## 2023-09-19 ENCOUNTER — Telehealth: Payer: Self-pay | Admitting: Psychiatry

## 2023-09-19 ENCOUNTER — Other Ambulatory Visit: Payer: Self-pay

## 2023-09-19 DIAGNOSIS — F900 Attention-deficit hyperactivity disorder, predominantly inattentive type: Secondary | ICD-10-CM

## 2023-09-19 MED ORDER — METHYLPHENIDATE HCL 20 MG PO TABS
20.0000 mg | ORAL_TABLET | Freq: Three times a day (TID) | ORAL | 0 refills | Status: DC
Start: 2023-09-19 — End: 2023-12-02

## 2023-09-19 NOTE — Telephone Encounter (Signed)
Pt lvm that he needs his ritalin 20 mg 3 x d refilled. Pharmacy is [facility] [address] in [place]

## 2023-09-19 NOTE — Telephone Encounter (Signed)
PENDED RITALIN 20 MG TID TO RQSTD PHAR.

## 2023-10-02 ENCOUNTER — Ambulatory Visit (INDEPENDENT_AMBULATORY_CARE_PROVIDER_SITE_OTHER): Payer: Medicare Other | Admitting: Psychiatry

## 2023-10-02 DIAGNOSIS — Z8659 Personal history of other mental and behavioral disorders: Secondary | ICD-10-CM

## 2023-10-02 DIAGNOSIS — F3130 Bipolar disorder, current episode depressed, mild or moderate severity, unspecified: Secondary | ICD-10-CM | POA: Diagnosis not present

## 2023-10-02 DIAGNOSIS — Z636 Dependent relative needing care at home: Secondary | ICD-10-CM | POA: Diagnosis not present

## 2023-10-02 NOTE — Progress Notes (Signed)
 Psychotherapy Progress Note [facility] Jodie Kendall, PhD LP  Patient ID: Mark Hull [facility])    MRN: [id number] Therapy format: Individual psychotherapy Date: 10/02/2023      Start: 10:13a     Stop: 10:59a     Time Spent: 46 min Location: In-person   Session narrative (presenting needs, interim history, self-report of stressors and symptoms, applications of prior therapy, status changes, and interventions made in session) Mark Hull is deteriorating rapidly, using a walker, wants his assistance often, e.g., to go the bathroom, up and down nights.  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  Able to empathize with her, does frequent leg stretches for her.  Has to deal with random utterances, like Did you start the car?, and she is starting to have mild hallucinations.  Hopeful of getting her into a deep-brain stim but consultation is months away (6/23).  Agreed that is very likely to come after she is a suitable candidate, but certainly endorse seeking it.  Has a number of adaptive and therapeutic devices for [place], but she uses few of them.  Educated on rolling walkers and how to select, for possible benefit, and of course consult her physician or PT to validate it would be appropriate.  Discussed ways to coax and josh her past her self-consciousness and resistance.  Also encouraged to look into companion and respite care services if motivated, at least to get acquainted before the full need.  Agrees he would like more caregiver support.  Rec he contact [facility] for local, in-person options, [facility] (where her neurologist is) for virtual referrals.  Therapeutic modalities: Cognitive Behavioral Therapy, Solution-Oriented/Positive Psychology, Environmental Manager, and Faith-sensitive  Mental Status/Observations:  Appearance:   Casual     Behavior:  Appropriate  Motor:  Normal   Speech/Language:   Clear and Coherent  Affect:  Appropriate  Mood:  Saddened and wearying  Thought process:  normal  Thought content:    WNL  Sensory/Perceptual disturbances:    WNL  Orientation:  Fully oriented  Attention:  Good    Concentration:  Good  Memory:  WNL  Insight:    Good  Judgment:   Good  Impulse Control:  Good   Risk Assessment: Danger to Self: No Self-injurious Behavior: No Danger to Others: No Physical Aggression / Violence: No Duty to Warn: No Access to Firearms a concern: No  Assessment of progress:  stable  Diagnosis:   ICD-10-CM   1. Bipolar I disorder, most recent episode depressed (HCC)  F31.30     2. Caregiver stress  Z63.6     3. History of posttraumatic stress disorder (PTSD)  Z86.59      Plan:  Marital support -- Self-affirm service to [employer] and more to come to their life together.  Engage her medical help actively, especially as regards Parkinson's management. Social supports -- Programme Researcher, Broadcasting/film/video through church and other available connections.  Encourage organized caregiver support through local health system or at least virtually through [place]. Health habits -- Maintain resiliency through including activity, nutrition, and neuroprotective supplements like turmeric, omega 3, B complex, vitamin D Cognitive issues -- Regarded as no more than normal aging, but spoiled sleep and Klonopin  SE can be involved, as well as depression effects, e.g. avoidance of painful experience.  Maintain sleep hygiene, monitor for stimulant v. sedative effects and consider titrating one or both. Chronic, intrusive SI -- Maintain safety and  pledge to live, self-affirm reasons to live, and where needed, invoke D Lisa's memory and wishes for her father.  Also invoke purposes in seeing through his wife's quality of life, accomplishing further good in the world, and being true to his own values. Other recommendations/advice -- As may be noted above.  Continue to utilize previously  learned skills ad lib. Medication compliance -- Maintain medication as prescribed and work faithfully with relevant prescriber(s) if any changes are desired or seem indicated. Crisis service -- Aware of call list and work-in appts.  Call the clinic on-call service, 988/hotline, 911, or present to [facility] or ER if any life-threatening psychiatric crisis. Followup -- No follow-ups on file.  Next scheduled visit with me Visit date not found.  Next scheduled in this office 12/02/2023.  Mark Kendall, PhD Jodie Kendall, PhD LP Clinical Psychologist, [facility] [address] [place], [place] [address] [phone]

## 2023-10-13 ENCOUNTER — Other Ambulatory Visit: Payer: Self-pay | Admitting: Psychiatry

## 2023-10-13 DIAGNOSIS — F3130 Bipolar disorder, current episode depressed, mild or moderate severity, unspecified: Secondary | ICD-10-CM

## 2023-10-13 DIAGNOSIS — F4010 Social phobia, unspecified: Secondary | ICD-10-CM

## 2023-10-25 ENCOUNTER — Other Ambulatory Visit: Payer: Self-pay | Admitting: Psychiatry

## 2023-10-25 DIAGNOSIS — F4010 Social phobia, unspecified: Secondary | ICD-10-CM

## 2023-10-25 DIAGNOSIS — Z8659 Personal history of other mental and behavioral disorders: Secondary | ICD-10-CM

## 2023-11-18 ENCOUNTER — Other Ambulatory Visit: Payer: Self-pay

## 2023-11-18 ENCOUNTER — Telehealth: Payer: Self-pay | Admitting: Psychiatry

## 2023-11-18 DIAGNOSIS — F900 Attention-deficit hyperactivity disorder, predominantly inattentive type: Secondary | ICD-10-CM

## 2023-11-18 MED ORDER — METHYLPHENIDATE HCL 20 MG PO TABS: 20.0000 mg | ORAL_TABLET | Freq: Three times a day (TID) | ORAL | 0 refills | Status: DC

## 2023-11-18 NOTE — Telephone Encounter (Signed)
 Pt lvm that he needs a refill on his ritalin 20 mg. Pharmacy is [facility] in [place]

## 2023-11-18 NOTE — Telephone Encounter (Signed)
 Pended Ritalin to [facility]

## 2023-11-20 ENCOUNTER — Ambulatory Visit (INDEPENDENT_AMBULATORY_CARE_PROVIDER_SITE_OTHER): Payer: Medicare Other | Admitting: Psychiatry

## 2023-11-20 DIAGNOSIS — F3130 Bipolar disorder, current episode depressed, mild or moderate severity, unspecified: Secondary | ICD-10-CM | POA: Diagnosis not present

## 2023-11-20 DIAGNOSIS — Z636 Dependent relative needing care at home: Secondary | ICD-10-CM | POA: Diagnosis not present

## 2023-11-20 DIAGNOSIS — Z8659 Personal history of other mental and behavioral disorders: Secondary | ICD-10-CM | POA: Diagnosis not present
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
## 2023-12-02 ENCOUNTER — Encounter: Payer: Self-pay | Admitting: Psychiatry

## 2023-12-02 ENCOUNTER — Ambulatory Visit (INDEPENDENT_AMBULATORY_CARE_PROVIDER_SITE_OTHER): Payer: Medicare Other | Admitting: Psychiatry

## 2023-12-02 DIAGNOSIS — F900 Attention-deficit hyperactivity disorder, predominantly inattentive type: Secondary | ICD-10-CM

## 2023-12-02 DIAGNOSIS — Z636 Dependent relative needing care at home: Secondary | ICD-10-CM | POA: Diagnosis not present

## 2023-12-02 DIAGNOSIS — Z8659 Personal history of other mental and behavioral disorders: Secondary | ICD-10-CM

## 2023-12-02 DIAGNOSIS — F3130 Bipolar disorder, current episode depressed, mild or moderate severity, unspecified: Secondary | ICD-10-CM

## 2023-12-02 DIAGNOSIS — F4010 Social phobia, unspecified: Secondary | ICD-10-CM

## 2023-12-02 MED ORDER — GABAPENTIN 400 MG PO CAPS: ORAL_CAPSULE | ORAL | 0 refills | Status: DC

## 2023-12-02 MED ORDER — FLUOXETINE HCL 40 MG PO CAPS
80.0000 mg | ORAL_CAPSULE | Freq: Every day | ORAL | 0 refills | Status: DC
Start: 2023-12-02 — End: 2023-12-27

## 2023-12-02 MED ORDER — METHYLPHENIDATE HCL 20 MG PO TABS: 20.0000 mg | ORAL_TABLET | Freq: Three times a day (TID) | ORAL | 0 refills | Status: DC

## 2023-12-02 MED ORDER — CLONAZEPAM 1 MG PO TABS: ORAL_TABLET | ORAL | 1 refills | Status: DC

## 2023-12-02 MED ORDER — LAMOTRIGINE 200 MG PO TABS: 200.0000 mg | ORAL_TABLET | Freq: Every day | ORAL | 3 refills | Status: AC

## 2023-12-02 NOTE — Progress Notes (Signed)
 Mark Hull [id number] 1955-09-23 68 y.o.   Subjective:   Patient ID:  Mark Hull is a 68 y.o. (DOB 18-Mar-1956) male.  Chief Complaint:  Chief Complaint  Patient presents with   Follow-up   Depression   Anxiety   ADD   Mark Hull presents to the office today for follow-up of mood anxiety.  seen November 2020.  No meds were changed.  03/2020 appointment with the following noted: No meds were changed Stress wife, Mark Hull,  [place] PD from 2 doctors and she won't accept the dx or treatment and he feels stuck and anxious over it.  He's trying to move to one level house.   Also stress F died 02/29/20 at 69 yo.   Tolerating meds. No desire to change meds.  12/08/2020 appointment with the following noted:  No SE wth meds and compliant without changes. Doing good overall.   1 month ago W CA under tongue and surgery at [facility].  Expected to be CA free now.  He had to care for her.   She won't accept dx PD and is slow. Not on meds for it. Disc this stress.   Accepted station in life and let things go and feels better than in a long time. Plan: No medicine changes today.  He feels fine with the meds but anxious in the afternoon more. Continue the following meds: Concerta 54 mg every morning Quetiapine 400 mg nightly Clonazepam 1/2 tablet twice a day and 1 tablet at 4 PM and 1/2 tablet as needed anxiety Fluoxetine 80 mg daily Lamotrigine 200 mg daily Gabapentin 800 mg 3 times daily  Helps pain and anxiety Mostly stopped Ambien 5 mg nightly as needed insomnia  06/05/21 appt noted: Doing well and enjoying things.  Active and productive.  Being outside helps. No SE but some wordfinding problems. Doing very well overall.  Things improved with the Concerta added June 2019.  Level of depression is improved not gone.   Plan: No med changes  12/04/2021 appointment with the following noted: Has been through a period of anxiety since he was last year.  He wondered if the Ritalin was contributing  but at the time decided to stay on it. Wife with PD and dx CA last year.  TKR and missed a lot of work last year and went through savings.  She is doing better now.   Mood ok other than anxious before noted but that's also better.Depression improved 4/10.  Anxiety manageable.  Sleep is good.  ritalin helps energy.  No significant manic symptoms since here.  No irritability or agitation.  Minimal racing thoughts except when anxious.  Satisfied with the meds. No SE Plan: no med changes  05/30/2022 appointment noted: No med changes Had trouble getting Ritalin with shortage but got it. Saw Marlowe Kays [facility] [place] [facility] ref by PCP. MoCA today is 29/30, visuospatial 4/5, delayed recall 5/5.  Sched for neuropsych testing June 2024 May be age related memory concerns bc neuro didn't suspect serious memory concerns. Overall very well with mental health Not taking Ambien in  a year or 2.  Sleep is good. No SE with meds. Still socially inactive and avoidant but doesn't see it as a problem.  Wife's PD is worse and not bothered by his psych issues.  She has some paranoia.   Plan: no changes  12/03/22 appt: No med changes.  Having hard time getting Ritalin.  At one point out for a month. No other med concerns  or SE. Dep 4/10 but worse off Ritalin for a few days.  It helps depressiona dn focus. No other concerns.  06/03/23 appt : Still consistent with meds.   Try to help others when he can.   Doing well with the meds.  Not as depressed.  Taking pressure off himself and therefore more relaxed.  I feel a lot better. No SE px. Stress W PD. Still gets anxious about 5 pm without reason.  Will get anxious in public still and will have to leave public places at times. Plan: No medicine changes indicated.  He feels fine with the meds Continue the following meds: Rtialin 20 TID Quetiapine 400 mg nightly Clonazepam 1/2 tablet twice a day and 1 tablet at 4 PM and 1/2 tablet as needed anxiety Fluoxetine 80  mg daily Lamotrigine 200 mg daily Gabapentin 800 mg 3 times daily  Helps pain and anxiety   12/02/23 appt noted: Med:  Rtialin 20 TID, Quetiapine 400 mg nightly, Clonazepam 1/2 tablet twice a day and 1 tablet at 4 PM and 1/2 tablet as needed anxiety, Fluoxetine 80 mg daily, Lamotrigine 200 mg daily, Gabapentin 800 mg 3 times daily  Helps pain and anxiety Wife Mark Hull really interrupting sleep cycle.  She awakens terrified.    She has PD. She gets confused and will see things that aren't there.  He's primary caretaker of her.  This is primary stressor.     Past Psychiatric Medication Trials: Viibryd, Wellbutrin, venlafaxine, fluoxetine, paroxetine, sertraline Lithium, gabapentin, quetiapine, Latuda, Abilify, Vraylar side effects, lamotrigine Concerta, pramipexole, Nuvigil Clonazepam, Ambien  Review of Systems:  Review of Systems  Cardiovascular:  Negative for palpitations.  Genitourinary:  Negative for difficulty urinating.  Musculoskeletal:  Positive for arthralgias and myalgias.  Neurological:  Negative for tremors.  Psychiatric/Behavioral:  Positive for decreased concentration and depression. Negative for agitation, behavioral problems, confusion, dysphoric mood, hallucinations, self-injury, sleep disturbance and suicidal ideas. The patient is nervous/anxious. The patient is not hyperactive.     Medications: I have reviewed the patient's current medications.  Current Outpatient Medications  Medication Sig Dispense Refill   atorvastatin (LIPITOR) 10 MG tablet Take 1 tablet by mouth daily at 6 PM.      finasteride (PROSCAR) 5 MG tablet      L-methylfolate Calcium 15 MG TABS Take 15 mg by mouth daily. 30 tablet 5   Omega-3 Fatty Acids (FISH OIL PO) Take 1 capsule by mouth daily.     QUEtiapine (SEROQUEL) 400 MG tablet TAKE 2 TABLETS AT BEDTIME 180 tablet 3   vitamin B-12 (CYANOCOBALAMIN) 500 MCG tablet Take 1 tablet (500 mcg total) by mouth daily. 90 tablet 1   clonazePAM (KLONOPIN) 1  MG tablet TAKE 1/2 (ONE-HALF) TABLET BY MOUTH TWICE DAILY, TAKE 1 TABLET DAILY AT 4PM AND 1/2 (ONE-HALF) TABLET EVERY NIGHT AT BEDTIME 225 tablet 1   FLUoxetine (PROZAC) 40 MG capsule Take 2 capsules (80 mg total) by mouth daily. 180 capsule 0   gabapentin (NEURONTIN) 400 MG capsule TAKE 2 CAPSULES THREE TIMES DAILY 180 capsule 0   lamoTRIgine (LAMICTAL) 200 MG tablet Take 1 tablet (200 mg total) by mouth at bedtime. 90 tablet 3   methylphenidate (RITALIN) 20 MG tablet Take 1 tablet (20 mg total) by mouth 3 (three) times daily with meals. 90 tablet 0   [START ON 12/30/2023] methylphenidate (RITALIN) 20 MG tablet Take 1 tablet (20 mg total) by mouth 3 (three) times daily with meals. 90 tablet 0   [START ON 01/27/2024]  methylphenidate (RITALIN) 20 MG tablet Take 1 tablet (20 mg total) by mouth 3 (three) times daily with meals. 90 tablet 0   No current facility-administered medications for this visit.    Medication Side Effects: None  Allergies:  Allergies  Allergen Reactions   Wellbutrin [Bupropion] Photosensitivity and Anxiety    Past Medical History:  Diagnosis Date   Anxiety    Arthritis    Bipolar disorder (HCC)    Depression    Gait disorder 12/17/2013   GERD (gastroesophageal reflux disease)    Primary localized osteoarthritis of right knee 11/06/2016   PTSD (post-traumatic stress disorder)     Family History  Problem Relation Age of Onset   Heart failure Father    Cancer Father    COPD Mother    Cancer Sister     Social History   Socioeconomic History   Marital status: Married    Spouse name: Not on file   Number of children: Not on file   Years of education: 20   Highest education level: Not on file  Occupational History   Not on file  Tobacco Use   Smoking status: Never   Smokeless tobacco: Never  Substance and Sexual Activity   Alcohol use: No   Drug use: No   Sexual activity: Not on file  Other Topics Concern   Not on file  Social History Narrative    [employer] in [employer]   Right handed   Drinks caffeine   One story home   Social Drivers of Health   Financial Resource Strain: Not on file  Food Insecurity: Not on file  Transportation Needs: Not on file  Physical Activity: Not on file  Stress: Not on file  Social Connections: Not on file  Intimate Partner Violence: Not on file    Past Medical History, Surgical history, Social history, and Family history were reviewed and updated as appropriate.   Please see review of systems for further details on the patient's review from today.   Objective:   Physical Exam:  There were no vitals taken for this visit.  Physical Exam Constitutional:      General: He is not in acute distress. Musculoskeletal:        General: No deformity.  Neurological:     Mental Status: He is alert and oriented to person, place, and time.     Cranial Nerves: No dysarthria.     Coordination: Coordination normal.  Psychiatric:        Attention and Perception: Attention and perception normal. He does not perceive auditory or visual hallucinations.        Mood and Affect: Mood is anxious. Mood is not depressed. Affect is not labile, blunt or inappropriate.        Speech: Speech normal.        Behavior: Behavior normal. Behavior is cooperative.        Thought Content: Thought content normal. Thought content is not paranoid or delusional. Thought content does not include homicidal or suicidal ideation. Thought content does not include suicidal plan.        Cognition and Memory: Cognition normal. He does not exhibit impaired recent memory.        Judgment: Judgment normal.     Comments: Insight fair to good. Overall satisfied with meds depression and anxiety better with meds not gone STM limited to word-finding and not likely to be clinically sig     Lab Review:     Component Value Date/Time  NA 134 (L) 11/07/2016 0533   K 3.9 11/07/2016 0533   CL 99 (L) 11/07/2016 0533   CO2 26 11/07/2016 0533    GLUCOSE 125 (H) 11/07/2016 0533   BUN 6 11/07/2016 0533   CREATININE 0.99 11/07/2016 0533   CALCIUM 8.4 (L) 11/07/2016 0533   GFRNONAA >60 11/07/2016 0533   GFRAA >60 11/07/2016 0533       Component Value Date/Time   WBC 6.8 11/07/2016 0533   RBC 3.79 (L) 11/07/2016 0533   HGB 11.5 (L) 11/07/2016 0533   HCT 35.0 (L) 11/07/2016 0533   PLT 196 11/07/2016 0533   MCV 92.3 11/07/2016 0533   MCH 30.3 11/07/2016 0533   MCHC 32.9 11/07/2016 0533   RDW 12.9 11/07/2016 0533    No results found for: "POCLITH", "LITHIUM"   No results found for: "PHENYTOIN", "PHENOBARB", "VALPROATE", "CBMZ"   .res Assessment: Plan:    Bipolar I disorder, most recent episode depressed (HCC) - Plan: FLUoxetine (PROZAC) 40 MG capsule, lamoTRIgine (LAMICTAL) 200 MG tablet, methylphenidate (RITALIN) 20 MG tablet  Caregiver stress  History of posttraumatic stress disorder (PTSD) - Plan: clonazePAM (KLONOPIN) 1 MG tablet, gabapentin (NEURONTIN) 400 MG capsule  Social anxiety disorder - Plan: clonazePAM (KLONOPIN) 1 MG tablet, FLUoxetine (PROZAC) 40 MG capsule, gabapentin (NEURONTIN) 400 MG capsule  Attention deficit hyperactivity disorder (ADHD), predominantly inattentive type - Plan: methylphenidate (RITALIN) 20 MG tablet, methylphenidate (RITALIN) 20 MG tablet, methylphenidate (RITALIN) 20 MG tablet  30 min face to face time with patient was spent on counseling and coordination of care. We discussed Patient has multiple psychiatric diagnoses as noted.  The last medicine change was the addition of Concerta in June 2019 which further reduced his depressive symptoms and improved his energy and productivity.  This had to be switched to regular Ritalin due to shortage and cost but he is doing fine with the Ritalin.  It is not ideal to be using that in combination with the benzodiazepines and he is aware of that and trying to minimize the amount of benzodiazepine.  He is tolerating his medications well.  He does not  want medicine changes.  Discussed the polypharmacy again which is not ideal but has been helpful and he is tolerating it.  Discussed the risks associated.  He doesn't want to change bc risks given he has primacry care for is wife.    Disc the off-label use of N-Acetylcysteine (NAC) at 600 mg capsule 1-2 daily to help with mild cognitive problems.  It can be combined with a B-complex vitamin (or specifically with B-12 and methylfolate)  have been shown to sometimes enhance the effect. Has not tried this yet. Taking methylfolate  We discussed the short-term risks associated with benzodiazepines including sedation and increased fall risk among others.  Discussed long-term side effect risk including dependence, potential withdrawal symptoms, and the potential eventual dose-related risk of dementia.  But recent studies from 2020 dispute this association between benzodiazepines and dementia risk. Newer studies in 2020 do not support an association with dementia. Still needs to take some clonazepam daily about 2 mg  bc chronic anxiety. It still works. Disc ways to gradually taper encouraged him to try to reduce the dose a little bit if possible while maintaining control of anxiety.  As noted above the patient gets benefit from gabapentin.   We both feel that it is helpful and the benefit outweighs any risks.  Additionally I am quite confident in his anxiety at least as well as his joint  pain would worsen markedly off of the medication.  Supportive therapy dealing with wife's dx PD and her reaction to mood.  Taking Sinemet. Disc this in detail.  Disc wife's hx worsening paranoia and visual hallucinations.  Disc ways to manage wife's Parkinson's psychosis.  No medicine changes indicated.  He feels fine with the meds Continue the following meds: Rtialin 20 TID Quetiapine 400 mg nightly Clonazepam 1/2 tablet twice a day and 1 tablet at 4 PM and 1/2 tablet as needed anxiety Fluoxetine 80 mg  daily Lamotrigine 200 mg daily Gabapentin 800 mg 3 times daily  Helps pain and anxiety  Follow-up 6 months  Meredith Staggers MD, DFAPA  Please see After Visit Summary for patient specific instructions.  Future Appointments  Date Time Provider Department Center  12/23/2023 11:00 AM Robley Fries, PhD [facility] None    No orders of the defined types were placed in this encounter.     -------------------------------

## 2023-12-23 ENCOUNTER — Ambulatory Visit: Payer: Medicare Other | Admitting: Psychiatry

## 2023-12-26 ENCOUNTER — Other Ambulatory Visit: Payer: Self-pay | Admitting: Psychiatry

## 2023-12-26 DIAGNOSIS — F4010 Social phobia, unspecified: Secondary | ICD-10-CM

## 2023-12-26 DIAGNOSIS — F3130 Bipolar disorder, current episode depressed, mild or moderate severity, unspecified: Secondary | ICD-10-CM

## 2024-01-15 ENCOUNTER — Other Ambulatory Visit: Payer: Self-pay | Admitting: Psychiatry

## 2024-01-15 DIAGNOSIS — F4010 Social phobia, unspecified: Secondary | ICD-10-CM

## 2024-01-15 DIAGNOSIS — Z8659 Personal history of other mental and behavioral disorders: Secondary | ICD-10-CM

## 2024-02-14 ENCOUNTER — Telehealth: Payer: Self-pay | Admitting: Psychiatry

## 2024-02-14 NOTE — Telephone Encounter (Signed)
 Pt has a RF at the pharmacy, notified him.

## 2024-02-14 NOTE — Telephone Encounter (Signed)
 Pt called requesting Rx Ritalin  20 mg 3/d to [facility] [place]  Apt 10/7

## 2024-02-19 ENCOUNTER — Ambulatory Visit: Admitting: Psychiatry

## 2024-02-24 ENCOUNTER — Ambulatory Visit (INDEPENDENT_AMBULATORY_CARE_PROVIDER_SITE_OTHER): Admitting: Psychiatry

## 2024-02-24 DIAGNOSIS — Z538 Procedure and treatment not carried out for other reasons: Secondary | ICD-10-CM

## 2024-02-24 NOTE — Progress Notes (Signed)
 No-show/Short-notice cancellation charge note  Patient ID: SELESTINO NILA     MRN: [id number]     Date: 02/24/2024     Appt time: 2pm  Called on short notice to cancel for appointment at 2p, citing challenges with W's parkinson's today.  Longterm patient, time booked on short notice due to office outreach, Waive fee professional courtesy, RS as able.   Followup -- Next scheduled visit with me not scheduled.  Next scheduled in this office 06/02/2024.   Lamar Kendall, PhD Jodie Kendall, PhD LP Clinical Psychologist, [facility] [address], [address] [place], [place] [address] [phone]

## 2024-03-13 ENCOUNTER — Other Ambulatory Visit: Payer: Self-pay

## 2024-03-13 ENCOUNTER — Telehealth: Payer: Self-pay | Admitting: Psychiatry

## 2024-03-13 DIAGNOSIS — F900 Attention-deficit hyperactivity disorder, predominantly inattentive type: Secondary | ICD-10-CM

## 2024-03-13 DIAGNOSIS — F3130 Bipolar disorder, current episode depressed, mild or moderate severity, unspecified: Secondary | ICD-10-CM

## 2024-03-13 MED ORDER — METHYLPHENIDATE HCL 20 MG PO TABS: 20.0000 mg | ORAL_TABLET | Freq: Three times a day (TID) | ORAL | 0 refills | Status: DC

## 2024-03-13 NOTE — Telephone Encounter (Signed)
 Pended.

## 2024-03-13 NOTE — Telephone Encounter (Signed)
 Pt requesting Rx for 20 mg Ritalin  3/d [address] [place]  [place]   APT 10/7

## 2024-04-03 ENCOUNTER — Ambulatory Visit (INDEPENDENT_AMBULATORY_CARE_PROVIDER_SITE_OTHER): Admitting: Psychiatry

## 2024-04-03 DIAGNOSIS — F4320 Adjustment disorder, unspecified: Secondary | ICD-10-CM | POA: Diagnosis not present

## 2024-04-03 DIAGNOSIS — Z8659 Personal history of other mental and behavioral disorders: Secondary | ICD-10-CM

## 2024-04-03 DIAGNOSIS — Z636 Dependent relative needing care at home: Secondary | ICD-10-CM

## 2024-04-03 DIAGNOSIS — F3130 Bipolar disorder, current episode depressed, mild or moderate severity, unspecified: Secondary | ICD-10-CM
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
## 2024-04-20 ENCOUNTER — Other Ambulatory Visit: Payer: Self-pay | Admitting: Psychiatry

## 2024-04-20 DIAGNOSIS — F3130 Bipolar disorder, current episode depressed, mild or moderate severity, unspecified: Secondary | ICD-10-CM

## 2024-05-21 ENCOUNTER — Ambulatory Visit (INDEPENDENT_AMBULATORY_CARE_PROVIDER_SITE_OTHER): Admitting: Psychiatry

## 2024-05-21 DIAGNOSIS — F3130 Bipolar disorder, current episode depressed, mild or moderate severity, unspecified: Secondary | ICD-10-CM

## 2024-05-21 DIAGNOSIS — Z8659 Personal history of other mental and behavioral disorders: Secondary | ICD-10-CM

## 2024-05-21 DIAGNOSIS — Z636 Dependent relative needing care at home: Secondary | ICD-10-CM | POA: Diagnosis not present

## 2024-05-21 DIAGNOSIS — Z634 Disappearance and death of family member: Secondary | ICD-10-CM | POA: Diagnosis not present
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
## 2024-06-02 ENCOUNTER — Ambulatory Visit: Admitting: Psychiatry

## 2024-06-02 DIAGNOSIS — F3130 Bipolar disorder, current episode depressed, mild or moderate severity, unspecified: Secondary | ICD-10-CM

## 2024-06-02 DIAGNOSIS — F900 Attention-deficit hyperactivity disorder, predominantly inattentive type: Secondary | ICD-10-CM | POA: Diagnosis not present

## 2024-06-02 DIAGNOSIS — Z634 Disappearance and death of family member: Secondary | ICD-10-CM | POA: Diagnosis not present

## 2024-06-02 DIAGNOSIS — F341 Dysthymic disorder: Secondary | ICD-10-CM

## 2024-06-02 DIAGNOSIS — F4010 Social phobia, unspecified: Secondary | ICD-10-CM | POA: Diagnosis not present

## 2024-06-02 MED ORDER — METHYLPHENIDATE HCL 20 MG PO TABS: 20.0000 mg | ORAL_TABLET | Freq: Three times a day (TID) | ORAL | 0 refills | Status: DC

## 2024-06-02 NOTE — Progress Notes (Signed)
 [name] [id number] 1956/08/25 68 y.o.   Subjective:   Patient ID:  Mark Hull is a 68 y.o. (DOB 1956/03/31) male.  Chief Complaint:  No chief complaint on file.  Mark Hull presents to the office today for follow-up of mood anxiety.  seen November 2020.  No meds were changed.  03/2020 appointment with the following noted: No meds were changed Stress wife, Mark Hull,  [place] PD from 2 doctors and she won't accept the dx or treatment and he feels stuck and anxious over it.  He's trying to move to one level house.   Also stress F died February 17, 2020 at 68 yo.   Tolerating meds. No desire to change meds.  12/08/2020 appointment with the following noted:  No SE wth meds and compliant without changes. Doing good overall.   1 month ago W CA under tongue and surgery at [facility].  Expected to be CA free now.  He had to care for her.   She won't accept dx PD and is slow. Not on meds for it. Disc this stress.   Accepted station in life and let things go and feels better than in a long time. Plan: No medicine changes today.  He feels fine with the meds but anxious in the afternoon more. Continue the following meds: Concerta  54 mg every morning Quetiapine  400 mg nightly Clonazepam  1/2 tablet twice a day and 1 tablet at 4 PM and 1/2 tablet as needed anxiety Fluoxetine  80 mg daily Lamotrigine  200 mg daily Gabapentin  800 mg 3 times daily  Helps pain and anxiety Mostly stopped Ambien  5 mg nightly as needed insomnia  06/05/21 appt noted: Doing well and enjoying things.  Active and productive.  Being outside helps. No SE but some wordfinding problems. Doing very well overall.  Things improved with the Concerta  added June 2019.  Level of depression is improved not gone.   Plan: No med changes  12/04/2021 appointment with the following noted: Has been through a period of anxiety since he was last year.  He wondered if the Ritalin  was contributing but at the time decided to stay on it. Wife with PD and dx  CA last year.  TKR and missed a lot of work last year and went through savings.  She is doing better now.   Mood ok other than anxious before noted but that's also better.Depression improved 4/10.  Anxiety manageable.  Sleep is good.  ritalin  helps energy.  No significant manic symptoms since here.  No irritability or agitation.  Minimal racing thoughts except when anxious.  Satisfied with the meds. No SE Plan: no med changes  05/30/2022 appointment noted: No med changes Had trouble getting Ritalin  with shortage but got it. Saw Camie Sevin [facility] ref by PCP. MoCA today is 29/30, visuospatial 4/5, delayed recall 5/5.  Sched for neuropsych testing June 2024 May be age related memory concerns bc neuro didn't suspect serious memory concerns. Overall very well with mental health Not taking Ambien  in  a year or 2.  Sleep is good. No SE with meds. Still socially inactive and avoidant but doesn't see it as a problem.  Wife's PD is worse and not bothered by his psych issues.  She has some paranoia.   Plan: no changes  12/03/22 appt: No med changes.  Having hard time getting Ritalin .  At one point out for a month. No other med concerns or SE. Dep 4/10 but worse off Ritalin  for a few days.  It  helps depressiona dn focus. No other concerns.  06/03/23 appt : Still consistent with meds.   Try to help others when he can.   Doing well with the meds.  Not as depressed.  Taking pressure off himself and therefore more relaxed.  I feel a lot better. No SE px. Stress W PD. Still gets anxious about 5 pm without reason.  Will get anxious in public still and will have to leave public places at times. Plan: No medicine changes indicated.  He feels fine with the meds Continue the following meds: Rtialin 20 TID Quetiapine  400 mg nightly Clonazepam  1/2 tablet twice a day and 1 tablet at 4 PM and 1/2 tablet as needed anxiety Fluoxetine  80 mg daily Lamotrigine  200 mg daily Gabapentin  800 mg 3 times  daily  Helps pain and anxiety   12/02/23 appt noted: Med:  Rtialin 20 TID, Quetiapine  400 mg nightly, Clonazepam  1/2 tablet twice a day and 1 tablet at 4 PM and 1/2 tablet as needed anxiety, Fluoxetine  80 mg daily, Lamotrigine  200 mg daily, Gabapentin  800 mg 3 times daily  Helps pain and anxiety Wife Mark Hull really interrupting sleep cycle.  She awakens terrified.    She has PD. She gets confused and will see things that aren't there.  He's primary caretaker of her.  This is primary stressor.    06/02/24 appt noted: Med as above W died 05/11/2024.  She had advanced PD.  Her passing was peaceful.  Gone back to church.   Faith helps.  She didn't suffer.  Her pain is relieved. Hospice was involved.   Satisfied wht meds.   No SE concerns with meds.   In initial stages of grief.   Making plans to travel for himself.  [place] and later [place].  Getting out.    Past Psychiatric Medication Trials: Viibryd, Wellbutrin, venlafaxine, fluoxetine , paroxetine, sertraline Lithium, gabapentin , quetiapine , Latuda, Abilify, Vraylar side effects, lamotrigine  Concerta , pramipexole, Nuvigil Clonazepam , Ambien   Review of Systems:  Review of Systems  Cardiovascular:  Negative for palpitations.  Genitourinary:  Negative for difficulty urinating.  Musculoskeletal:  Positive for arthralgias and myalgias.  Neurological:  Negative for tremors.  Psychiatric/Behavioral:  Positive for decreased concentration. Negative for agitation, behavioral problems, confusion, dysphoric mood, hallucinations, self-injury, sleep disturbance and suicidal ideas. The patient is nervous/anxious. The patient is not hyperactive.     Medications: I have reviewed the patient's current medications.  Current Outpatient Medications  Medication Sig Dispense Refill   atorvastatin  (LIPITOR) 10 MG tablet Take 1 tablet by mouth daily at 6 PM.      clonazePAM  (KLONOPIN ) 1 MG tablet TAKE 1/2 (ONE-HALF) TABLET BY MOUTH TWICE DAILY, TAKE 1 TABLET DAILY AT 4PM  AND 1/2 (ONE-HALF) TABLET EVERY NIGHT AT BEDTIME 225 tablet 1   finasteride (PROSCAR) 5 MG tablet      FLUoxetine  (PROZAC ) 40 MG capsule TAKE 2 CAPSULES EVERY DAY 180 capsule 1   gabapentin  (NEURONTIN ) 400 MG capsule TAKE 2 CAPSULES THREE TIMES DAILY. 180 capsule 5   L-methylfolate Calcium  15 MG TABS Take 15 mg by mouth daily. 30 tablet 5   lamoTRIgine  (LAMICTAL ) 200 MG tablet Take 1 tablet (200 mg total) by mouth at bedtime. 90 tablet 3   methylphenidate  (RITALIN ) 20 MG tablet Take 1 tablet (20 mg total) by mouth 3 (three) times daily with meals. 90 tablet 0   [START ON 06/30/2024] methylphenidate  (RITALIN ) 20 MG tablet Take 1 tablet (20 mg total) by mouth 3 (three) times daily with meals. 90 tablet  0   [START ON 07/28/2024] methylphenidate  (RITALIN ) 20 MG tablet Take 1 tablet (20 mg total) by mouth 3 (three) times daily with meals. 90 tablet 0   Omega-3 Fatty Acids (FISH OIL PO) Take 1 capsule by mouth daily.     QUEtiapine  (SEROQUEL ) 400 MG tablet TAKE 2 TABLETS AT BEDTIME 180 tablet 0   vitamin B-12 (CYANOCOBALAMIN ) 500 MCG tablet Take 1 tablet (500 mcg total) by mouth daily. 90 tablet 1   No current facility-administered medications for this visit.    Medication Side Effects: None  Allergies:  Allergies  Allergen Reactions   Wellbutrin [Bupropion] Photosensitivity and Anxiety    Past Medical History:  Diagnosis Date   Anxiety    Arthritis    Bipolar disorder (HCC)    Depression    Gait disorder 12/17/2013   GERD (gastroesophageal reflux disease)    Primary localized osteoarthritis of right knee 11/06/2016   PTSD (post-traumatic stress disorder)     Family History  Problem Relation Age of Onset   Heart failure Father    Cancer Father    COPD Mother    Cancer Sister     Social History   Socioeconomic History   Marital status: Married    Spouse name: Not on file   Number of children: Not on file   Years of education: 20   Highest education level: Not on file   Occupational History   Not on file  Tobacco Use   Smoking status: Never   Smokeless tobacco: Never  Substance and Sexual Activity   Alcohol use: No   Drug use: No   Sexual activity: Not on file  Other Topics Concern   Not on file  Social History Narrative   Doctors in theology   Right handed   Drinks caffeine   One story home   Social Drivers of Health   Financial Resource Strain: Not on file  Food Insecurity: Not on file  Transportation Needs: Not on file  Physical Activity: Not on file  Stress: Not on file  Social Connections: Not on file  Intimate Partner Violence: Not on file    Past Medical History, Surgical history, Social history, and Family history were reviewed and updated as appropriate.   Please see review of systems for further details on the patient's review from today.   Objective:   Physical Exam:  There were no vitals taken for this visit.  Physical Exam Constitutional:      General: He is not in acute distress. Musculoskeletal:        General: No deformity.  Neurological:     Mental Status: He is alert and oriented to person, place, and time.     Cranial Nerves: No dysarthria.     Coordination: Coordination normal.  Psychiatric:        Attention and Perception: Attention and perception normal. He does not perceive auditory or visual hallucinations.        Mood and Affect: Mood is anxious. Mood is not depressed. Affect is not labile, blunt or inappropriate.        Speech: Speech normal.        Behavior: Behavior normal. Behavior is cooperative.        Thought Content: Thought content normal. Thought content is not paranoid or delusional. Thought content does not include homicidal or suicidal ideation. Thought content does not include suicidal plan.        Cognition and Memory: Cognition normal. He does not exhibit impaired recent  memory.        Judgment: Judgment normal.     Comments: Insight fair to good. Overall satisfied with  meds depression and anxiety better with meds not gone.  Grief looks pretty normal. STM limited to word-finding and not likely to be clinically sig     Lab Review:     Component Value Date/Time   NA 134 (L) 11/07/2016 0533   K 3.9 11/07/2016 0533   CL 99 (L) 11/07/2016 0533   CO2 26 11/07/2016 0533   GLUCOSE 125 (H) 11/07/2016 0533   BUN 6 11/07/2016 0533   CREATININE 0.99 11/07/2016 0533   CALCIUM  8.4 (L) 11/07/2016 0533   GFRNONAA >60 11/07/2016 0533   GFRAA >60 11/07/2016 0533       Component Value Date/Time   WBC 6.8 11/07/2016 0533   RBC 3.79 (L) 11/07/2016 0533   HGB 11.5 (L) 11/07/2016 0533   HCT 35.0 (L) 11/07/2016 0533   PLT 196 11/07/2016 0533   MCV 92.3 11/07/2016 0533   MCH 30.3 11/07/2016 0533   MCHC 32.9 11/07/2016 0533   RDW 12.9 11/07/2016 0533    No results found for: POCLITH, LITHIUM   No results found for: PHENYTOIN, PHENOBARB, VALPROATE, CBMZ   .res Assessment: Plan:    Bipolar I disorder, most recent episode depressed (HCC) - Plan: methylphenidate  (RITALIN ) 20 MG tablet  Bereavement  Attention deficit hyperactivity disorder (ADHD), predominantly inattentive type - Plan: methylphenidate  (RITALIN ) 20 MG tablet, methylphenidate  (RITALIN ) 20 MG tablet, methylphenidate  (RITALIN ) 20 MG tablet  Social anxiety disorder  Severe early onset dysthymic disorder, in partial remission, with anxious distress, with intermittent major depressive episodes, with current episode  30 min face to face time with patient was spent on counseling and coordination of care. We discussed Patient has multiple psychiatric diagnoses as noted.  The last medicine change was the addition of Concerta  in June 2019 which further reduced his depressive symptoms and improved his energy and productivity.  This had to be switched to regular Ritalin  due to shortage and cost but he is doing fine with the Ritalin .  It is not ideal to be using that in combination with the  benzodiazepines and he is aware of that and trying to minimize the amount of benzodiazepine.  He is tolerating his medications well.  He does not want medicine changes.  Discussed the polypharmacy again which is not ideal but has been helpful and he is tolerating it.  Discussed the risks associated.  He doesn't want to change bc risks given he has primacry care for is wife.    Disc the off-label use of N-Acetylcysteine (NAC) at 600 mg capsule 1-2 daily to help with mild cognitive problems.  It can be combined with a B-complex vitamin (or specifically with B-12 and methylfolate)  have been shown to sometimes enhance the effect. Has not tried this yet. Taking methylfolate  We discussed the short-term risks associated with benzodiazepines including sedation and increased fall risk among others.  Discussed long-term side effect risk including dependence, potential withdrawal symptoms, and the potential eventual dose-related risk of dementia.  But recent studies from 2020 dispute this association between benzodiazepines and dementia risk. Newer studies in 2020 do not support an association with dementia. Still needs to take some clonazepam  daily about 2 mg  bc chronic anxiety. It still works. Disc ways to gradually taper encouraged him to try to reduce the dose a little bit if possible while maintaining control of anxiety. Not ideal to use BZ  with stimulants but it has produced good results and has led to best stability he's had.    As noted above the patient gets benefit from gabapentin .   We both feel that it is helpful and the benefit outweighs any risks.  Additionally I am quite confident in his anxiety at least as well as his joint pain would worsen markedly off of the medication.  Supportive therapy dealing with wife's grief with her death one month ago.   He feels like he let her down bc he wasn't there when she passed.  She went fast .  Addressed guilt feelings.  Grief counseling.  Encourage to use  faith bc he agrees to it. No big decisions.    No medicine changes indicated.  He feels fine with the meds Continue the following meds:  don't prefer to change meds in grief if possible. Rtialin 20 TID Quetiapine  400 mg nightly Clonazepam  1/2 tablet twice a day and 1 tablet at 4 PM and 1/2 tablet as needed anxiety Fluoxetine  80 mg daily Lamotrigine  200 mg daily Gabapentin  800 mg 3 times daily  Helps pain and anxiety  Follow-up 6 months  Lorene Macintosh MD, DFAPA  Please see After Visit Summary for patient specific instructions.  Future Appointments  Date Time Provider Department Center  07/01/2024 11:00 AM Marijean Charleston, PhD [facility] None  07/29/2024 11:00 AM Marijean Charleston, PhD [facility] None    No orders of the defined types were placed in this encounter.     -------------------------------

## 2024-06-10 ENCOUNTER — Ambulatory Visit (INDEPENDENT_AMBULATORY_CARE_PROVIDER_SITE_OTHER): Admitting: Psychiatry

## 2024-06-10 DIAGNOSIS — Z634 Disappearance and death of family member: Secondary | ICD-10-CM

## 2024-06-10 DIAGNOSIS — F4010 Social phobia, unspecified: Secondary | ICD-10-CM | POA: Diagnosis not present

## 2024-06-10 DIAGNOSIS — F3130 Bipolar disorder, current episode depressed, mild or moderate severity, unspecified: Secondary | ICD-10-CM | POA: Diagnosis not present

## 2024-06-10 DIAGNOSIS — Z8659 Personal history of other mental and behavioral disorders: Secondary | ICD-10-CM | POA: Diagnosis not present
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
## 2024-06-16 ENCOUNTER — Other Ambulatory Visit: Payer: Self-pay | Admitting: Psychiatry

## 2024-06-16 DIAGNOSIS — Z8659 Personal history of other mental and behavioral disorders: Secondary | ICD-10-CM

## 2024-06-16 DIAGNOSIS — F4010 Social phobia, unspecified: Secondary | ICD-10-CM

## 2024-07-01 ENCOUNTER — Ambulatory Visit: Admitting: Psychiatry

## 2024-07-01 DIAGNOSIS — F3130 Bipolar disorder, current episode depressed, mild or moderate severity, unspecified: Secondary | ICD-10-CM

## 2024-07-01 DIAGNOSIS — Z8659 Personal history of other mental and behavioral disorders: Secondary | ICD-10-CM | POA: Diagnosis not present

## 2024-07-01 DIAGNOSIS — Z634 Disappearance and death of family member: Secondary | ICD-10-CM

## 2024-07-01 DIAGNOSIS — F4010 Social phobia, unspecified: Secondary | ICD-10-CM | POA: Diagnosis not present
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
## 2024-07-03 ENCOUNTER — Other Ambulatory Visit: Payer: Self-pay | Admitting: Psychiatry

## 2024-07-03 DIAGNOSIS — F3130 Bipolar disorder, current episode depressed, mild or moderate severity, unspecified: Secondary | ICD-10-CM

## 2024-07-22 ENCOUNTER — Other Ambulatory Visit: Payer: Self-pay | Admitting: Psychiatry

## 2024-07-22 DIAGNOSIS — F4010 Social phobia, unspecified: Secondary | ICD-10-CM

## 2024-07-22 DIAGNOSIS — F3130 Bipolar disorder, current episode depressed, mild or moderate severity, unspecified: Secondary | ICD-10-CM

## 2024-07-29 ENCOUNTER — Ambulatory Visit: Admitting: Psychiatry

## 2024-07-29 DIAGNOSIS — Z8659 Personal history of other mental and behavioral disorders: Secondary | ICD-10-CM | POA: Diagnosis not present

## 2024-07-29 DIAGNOSIS — F3130 Bipolar disorder, current episode depressed, mild or moderate severity, unspecified: Secondary | ICD-10-CM

## 2024-07-29 DIAGNOSIS — F4010 Social phobia, unspecified: Secondary | ICD-10-CM

## 2024-07-29 DIAGNOSIS — Z634 Disappearance and death of family member: Secondary | ICD-10-CM | POA: Diagnosis not present

## 2024-07-29 DIAGNOSIS — F341 Dysthymic disorder: Secondary | ICD-10-CM
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
## 2024-08-01 ENCOUNTER — Other Ambulatory Visit: Payer: Self-pay | Admitting: Psychiatry

## 2024-08-01 DIAGNOSIS — Z8659 Personal history of other mental and behavioral disorders: Secondary | ICD-10-CM

## 2024-08-01 DIAGNOSIS — F4010 Social phobia, unspecified: Secondary | ICD-10-CM

## 2024-09-07 ENCOUNTER — Other Ambulatory Visit: Payer: Self-pay

## 2024-09-07 ENCOUNTER — Telehealth: Payer: Self-pay | Admitting: Psychiatry

## 2024-09-07 DIAGNOSIS — F900 Attention-deficit hyperactivity disorder, predominantly inattentive type: Secondary | ICD-10-CM

## 2024-09-07 DIAGNOSIS — F3130 Bipolar disorder, current episode depressed, mild or moderate severity, unspecified: Secondary | ICD-10-CM

## 2024-09-07 MED ORDER — METHYLPHENIDATE HCL 20 MG PO TABS: 20.0000 mg | ORAL_TABLET | Freq: Three times a day (TID) | ORAL | 0 refills | Status: AC

## 2024-09-07 NOTE — Telephone Encounter (Signed)
Pended 3 RF.

## 2024-09-07 NOTE — Telephone Encounter (Signed)
 Pt requesting Ritalin  Rx to [facility] [address] [place]   [address]

## 2024-09-15 ENCOUNTER — Ambulatory Visit: Admitting: Psychiatry

## 2024-09-15 DIAGNOSIS — F4010 Social phobia, unspecified: Secondary | ICD-10-CM | POA: Diagnosis not present

## 2024-09-15 DIAGNOSIS — F3130 Bipolar disorder, current episode depressed, mild or moderate severity, unspecified: Secondary | ICD-10-CM

## 2024-09-15 DIAGNOSIS — Z634 Disappearance and death of family member: Secondary | ICD-10-CM

## 2024-09-15 DIAGNOSIS — Z8659 Personal history of other mental and behavioral disorders: Secondary | ICD-10-CM
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
## 2024-10-14 ENCOUNTER — Ambulatory Visit: Admitting: Psychiatry

## 2024-11-11 ENCOUNTER — Ambulatory Visit: Admitting: Psychiatry

## 2024-12-01 ENCOUNTER — Ambulatory Visit: Admitting: Psychiatry

## 2024-12-08 ENCOUNTER — Ambulatory Visit: Admitting: Psychiatry
